# Patient Record
Sex: Female | Born: 1975 | Race: Black or African American | Hispanic: No | Marital: Single | Smoking: Former smoker
Health system: Southern US, Community
[De-identification: ages and names within clinical notes are randomized; demographics above are authoritative.]

## PROBLEM LIST (undated history)

## (undated) DIAGNOSIS — D219 Benign neoplasm of connective and other soft tissue, unspecified: Secondary | ICD-10-CM

## (undated) DIAGNOSIS — B019 Varicella without complication: Secondary | ICD-10-CM

## (undated) HISTORY — DX: Varicella without complication: B01.9

## (undated) HISTORY — DX: Benign neoplasm of connective and other soft tissue, unspecified: D21.9

## (undated) HISTORY — PX: NO PAST SURGERIES: SHX2092

---

## 2012-02-10 ENCOUNTER — Encounter: Payer: Self-pay | Admitting: *Deleted

## 2012-02-10 ENCOUNTER — Encounter: Payer: Self-pay | Admitting: Family Medicine

## 2012-02-10 ENCOUNTER — Ambulatory Visit (INDEPENDENT_AMBULATORY_CARE_PROVIDER_SITE_OTHER): Payer: No Typology Code available for payment source | Admitting: Family Medicine

## 2012-02-10 DIAGNOSIS — Z0000 Encounter for general adult medical examination without abnormal findings: Secondary | ICD-10-CM | POA: Insufficient documentation

## 2012-02-10 LAB — HEPATIC FUNCTION PANEL
ALT: 19 U/L (ref 0–35)
Albumin: 3.9 g/dL (ref 3.5–5.2)
Bilirubin, Direct: 0.1 mg/dL (ref 0.0–0.3)
Total Protein: 7.4 g/dL (ref 6.0–8.3)

## 2012-02-10 LAB — LIPID PANEL
Cholesterol: 153 mg/dL (ref 0–200)
LDL Cholesterol: 74 mg/dL (ref 0–99)
Triglycerides: 101 mg/dL (ref 0.0–149.0)

## 2012-02-10 LAB — BASIC METABOLIC PANEL
BUN: 6 mg/dL (ref 6–23)
CO2: 26 mEq/L (ref 19–32)
Calcium: 9.1 mg/dL (ref 8.4–10.5)
Chloride: 107 mEq/L (ref 96–112)
Creatinine, Ser: 1.1 mg/dL (ref 0.4–1.2)
Glucose, Bld: 87 mg/dL (ref 70–99)

## 2012-02-10 LAB — CBC WITH DIFFERENTIAL/PLATELET
Basophils Absolute: 0.1 10*3/uL (ref 0.0–0.1)
Basophils Relative: 0.8 % (ref 0.0–3.0)
HCT: 35.1 % — ABNORMAL LOW (ref 36.0–46.0)
Hemoglobin: 11.4 g/dL — ABNORMAL LOW (ref 12.0–15.0)
Lymphocytes Relative: 22.5 % (ref 12.0–46.0)
Lymphs Abs: 1.4 10*3/uL (ref 0.7–4.0)
MCHC: 32.6 g/dL (ref 30.0–36.0)
Monocytes Relative: 10.4 % (ref 3.0–12.0)
Neutro Abs: 4.1 10*3/uL (ref 1.4–7.7)
RBC: 4.19 Mil/uL (ref 3.87–5.11)
RDW: 18.4 % — ABNORMAL HIGH (ref 11.5–14.6)

## 2012-02-10 LAB — TSH: TSH: 1.31 u[IU]/mL (ref 0.35–5.50)

## 2012-02-10 NOTE — Patient Instructions (Signed)
We'll notify you of your lab results You look great!  Keep up the good work! Start an over the counter prenatal vitamin w/ DHA (fish oil) If you are anemic, we'll start an iron supplement to build up your levels prior to surgery Call with any questions or concerns Welcome!  We're glad to have you!!!

## 2012-02-10 NOTE — Progress Notes (Signed)
  Subjective:    Patient ID: Denise White, female    DOB: Feb 23, 1976, 36 y.o.   MRN: [id number]  HPI New to establish.  No previous PCP.  GYN- Dr Seymour Bars  Fibroids- has upcoming fibroid removal w/ Dr Seymour Bars.  Pt reports painful, heavy periods.  No other medical problems or concerns.   Review of Systems Patient reports no vision/ hearing changes, adenopathy,fever, weight change,  persistant/recurrent hoarseness , swallowing issues, chest pain, palpitations, edema, persistant/recurrent cough, hemoptysis, dyspnea (rest/exertional/paroxysmal nocturnal), gastrointestinal bleeding (melena, rectal bleeding), abdominal pain, significant heartburn, bowel changes, GU symptoms (dysuria, hematuria, incontinence),  syncope, focal weakness, memory loss, numbness & tingling, skin/hair/nail changes, abnormal bruising or bleeding, anxiety, or depression.     Objective:   Physical Exam  General Appearance:    Alert, cooperative, no distress, appears stated age  Head:    Normocephalic, without obvious abnormality, atraumatic  Eyes:    PERRL, conjunctiva/corneas clear, EOM's intact, fundi    benign, both eyes  Ears:    Normal TM's and external ear canals, both ears  Nose:   Nares normal, septum midline, mucosa normal, no drainage    or sinus tenderness  Throat:   Lips, mucosa, and tongue normal; teeth and gums normal  Neck:   Supple, symmetrical, trachea midline, no adenopathy;    Thyroid: no enlargement/tenderness/nodules  Back:     Symmetric, no curvature, ROM normal, no CVA tenderness  Lungs:     Clear to auscultation bilaterally, respirations unlabored  Chest Wall:    No tenderness or deformity   Heart:    Regular rate and rhythm, S1 and S2 normal, no murmur, rub   or gallop  Breast Exam:    No tenderness, masses, or nipple abnormality  Abdomen:     Soft, non-tender, bowel sounds active all four quadrants,    no masses, no organomegaly  Genitalia:    External genitalia normal, cervix normal in  appearance, no CMT, uterus in normal size and position, adnexa w/out mass or tenderness, mucosa pink and moist, no lesions or discharge present  Rectal:    Normal external appearance  Extremities:   Extremities normal, atraumatic, no cyanosis or edema  Pulses:   2+ and symmetric all extremities  Skin:   Skin color, texture, turgor normal, no rashes or lesions  Lymph nodes:   Cervical, supraclavicular, and axillary nodes normal  Neurologic:   CNII-XII intact, normal strength, sensation and reflexes    throughout          Assessment & Plan:

## 2012-02-10 NOTE — Progress Notes (Signed)
  Subjective:    Patient ID: Denise White, female    DOB: Dec 13, 1975, 36 y.o.   MRN: [id number]  HPI    Review of Systems     Objective:   Physical Exam OF NOTE- PT DID NOT HAVE BREAST OR GU EXAM (deferred to GYN).  All other PE documentation is correct.       Assessment & Plan:

## 2012-02-10 NOTE — Assessment & Plan Note (Signed)
Pt's PE WNL.  Check labs.  Anticipatory guidance provided.  

## 2012-02-12 LAB — VITAMIN D 1,25 DIHYDROXY
Vitamin D2 1, 25 (OH)2: <8 pg/mL
Vitamin D3 1, 25 (OH)2: 65 pg/mL

## 2012-02-13 ENCOUNTER — Encounter: Payer: Self-pay | Admitting: *Deleted

## 2012-05-05 ENCOUNTER — Encounter (HOSPITAL_COMMUNITY): Payer: Self-pay | Admitting: Pharmacist

## 2012-05-14 ENCOUNTER — Encounter (HOSPITAL_COMMUNITY): Payer: Self-pay

## 2012-05-14 ENCOUNTER — Encounter (HOSPITAL_COMMUNITY)
Admission: RE | Admit: 2012-05-14 | Discharge: 2012-05-14 | Disposition: A | Discharge status: Home / Self Care | Payer: No Typology Code available for payment source | Source: Ambulatory Visit | Attending: Obstetrics & Gynecology | Admitting: Obstetrics & Gynecology

## 2012-05-14 LAB — CBC
MCV: 89.9 fL (ref 78.0–100.0)
Platelets: 285 10*3/uL (ref 150–400)
RBC: 4.14 MIL/uL (ref 3.87–5.11)
RDW: 14.3 % (ref 11.5–15.5)
WBC: 6 10*3/uL (ref 4.0–10.5)

## 2012-05-14 LAB — SURGICAL PCR SCREEN
MRSA, PCR: NEGATIVE
Staphylococcus aureus: NEGATIVE

## 2012-05-14 NOTE — Patient Instructions (Addendum)
20 Rola Lennon  05/14/2012   Your procedure is scheduled on:  05/18/12  Enter through the Main Entrance of [facility] at 1130 AM.  Pick up the phone at the desk and dial [phone].   Call this number if you have problems the morning of surgery: [phone]   Remember:   Do not eat food:After Midnight.  Do not drink clear liquids: May have clear liquids up until 7AM day of surgery.  Take these medicines the morning of surgery with A SIP OF WATER: NA   Do not wear jewelry, make-up or nail polish.  Do not wear lotions, powders, or perfumes. You may wear deodorant.  Do not shave 48 hours prior to surgery.  Do not bring valuables to the hospital.  Contacts, dentures or bridgework may not be worn into surgery.  Leave suitcase in the car. After surgery it may be brought to your room.  For patients admitted to the hospital, checkout time is 11:00 AM the day of discharge.   Patients discharged the day of surgery will not be allowed to drive home.  Name and phone number of your driver: undecided  Special Instructions: CHG Shower Use Special Wash: 1/2 bottle night before surgery and 1/2 bottle morning of surgery.   Please read over the following fact sheets that you were given: MRSA Information

## 2012-05-17 ENCOUNTER — Other Ambulatory Visit: Payer: Self-pay | Admitting: Obstetrics & Gynecology

## 2012-05-18 ENCOUNTER — Encounter (HOSPITAL_COMMUNITY): Payer: Self-pay | Admitting: Registered Nurse

## 2012-05-18 ENCOUNTER — Encounter (HOSPITAL_COMMUNITY)
Admission: RE | Disposition: A | Discharge status: Home / Self Care | Payer: Self-pay | Source: Ambulatory Visit | Attending: Obstetrics & Gynecology

## 2012-05-18 ENCOUNTER — Ambulatory Visit (HOSPITAL_COMMUNITY): Payer: Self-pay | Admitting: Registered Nurse

## 2012-05-18 ENCOUNTER — Encounter (HOSPITAL_COMMUNITY): Payer: Self-pay | Admitting: *Deleted

## 2012-05-18 ENCOUNTER — Observation Stay (HOSPITAL_COMMUNITY)
Admission: RE | Admit: 2012-05-18 | Discharge: 2012-05-18 | Disposition: A | Discharge status: Home / Self Care | Payer: MEDICAID | Source: Ambulatory Visit | Attending: Obstetrics & Gynecology | Admitting: Obstetrics & Gynecology

## 2012-05-18 DIAGNOSIS — N949 Unspecified condition associated with female genital organs and menstrual cycle: Secondary | ICD-10-CM | POA: Insufficient documentation

## 2012-05-18 DIAGNOSIS — D251 Intramural leiomyoma of uterus: Principal | ICD-10-CM | POA: Insufficient documentation

## 2012-05-18 DIAGNOSIS — N920 Excessive and frequent menstruation with regular cycle: Secondary | ICD-10-CM | POA: Insufficient documentation

## 2012-05-18 DIAGNOSIS — Z01812 Encounter for preprocedural laboratory examination: Secondary | ICD-10-CM | POA: Insufficient documentation

## 2012-05-18 DIAGNOSIS — Z01818 Encounter for other preprocedural examination: Secondary | ICD-10-CM | POA: Insufficient documentation

## 2012-05-18 HISTORY — PX: ROBOT ASSISTED MYOMECTOMY: SHX5142

## 2012-05-18 LAB — TYPE AND SCREEN
ABO/RH(D): O POS
Antibody Screen: NEGATIVE

## 2012-05-18 LAB — CBC
MCH: 28.9 pg (ref 26.0–34.0)
MCV: 90.2 fL (ref 78.0–100.0)
Platelets: 261 10*3/uL (ref 150–400)
RBC: 3.88 MIL/uL (ref 3.87–5.11)
RDW: 13.8 % (ref 11.5–15.5)

## 2012-05-18 LAB — PREGNANCY, URINE: Preg Test, Ur: NEGATIVE

## 2012-05-18 SURGERY — ROBOTIC ASSISTED MYOMECTOMY
Anesthesia: General | Site: Abdomen | Wound class: Clean Contaminated

## 2012-05-18 MED ORDER — ACETAMINOPHEN 10 MG/ML IV SOLN
1000.0000 mg | Freq: Once | INTRAVENOUS | Status: AC
  Administered 2012-05-18: 1000 mg via INTRAVENOUS
  Filled 2012-05-18: qty 100

## 2012-05-18 MED ORDER — DEXAMETHASONE SODIUM PHOSPHATE 10 MG/ML IJ SOLN
INTRAMUSCULAR | Status: AC
  Filled 2012-05-18: qty 1

## 2012-05-18 MED ORDER — VASOPRESSIN 20 UNIT/ML IJ SOLN
INTRAMUSCULAR | Status: DC | PRN
  Administered 2012-05-18: 40 [IU] via INTRAMUSCULAR

## 2012-05-18 MED ORDER — FENTANYL CITRATE 0.05 MG/ML IJ SOLN
INTRAMUSCULAR | Status: AC
  Filled 2012-05-18: qty 5

## 2012-05-18 MED ORDER — FENTANYL CITRATE 0.05 MG/ML IJ SOLN
INTRAMUSCULAR | Status: AC
  Administered 2012-05-18: 50 ug via INTRAVENOUS
  Filled 2012-05-18: qty 2

## 2012-05-18 MED ORDER — IBUPROFEN 600 MG PO TABS: 600.0000 mg | ORAL_TABLET | Freq: Four times a day (QID) | ORAL | Status: DC | PRN

## 2012-05-18 MED ORDER — LACTATED RINGERS IR SOLN
Status: DC | PRN
  Administered 2012-05-18: 1

## 2012-05-18 MED ORDER — MIDAZOLAM HCL 5 MG/5ML IJ SOLN
INTRAMUSCULAR | Status: DC | PRN
  Administered 2012-05-18: 2 mg via INTRAVENOUS

## 2012-05-18 MED ORDER — LACTATED RINGERS IV SOLN
INTRAVENOUS | Status: DC
  Administered 2012-05-18 (×2): via INTRAVENOUS

## 2012-05-18 MED ORDER — DEXAMETHASONE SODIUM PHOSPHATE 4 MG/ML IJ SOLN
INTRAMUSCULAR | Status: DC | PRN
  Administered 2012-05-18: 10 mg via INTRAVENOUS

## 2012-05-18 MED ORDER — ONDANSETRON HCL 4 MG/2ML IJ SOLN
INTRAMUSCULAR | Status: DC | PRN
  Administered 2012-05-18: 4 mg via INTRAVENOUS

## 2012-05-18 MED ORDER — MEPERIDINE HCL 25 MG/ML IJ SOLN: 6.2500 mg | INTRAMUSCULAR | Status: DC | PRN

## 2012-05-18 MED ORDER — METOCLOPRAMIDE HCL 5 MG/ML IJ SOLN: 10.0000 mg | Freq: Once | INTRAMUSCULAR | Status: DC | PRN

## 2012-05-18 MED ORDER — LACTATED RINGERS IV SOLN: INTRAVENOUS | Status: DC

## 2012-05-18 MED ORDER — GLYCOPYRROLATE 0.2 MG/ML IJ SOLN
INTRAMUSCULAR | Status: DC | PRN
  Administered 2012-05-18: 0.6 mg via INTRAVENOUS

## 2012-05-18 MED ORDER — VASOPRESSIN 20 UNIT/ML IJ SOLN
INTRAMUSCULAR | Status: AC
  Filled 2012-05-18: qty 2

## 2012-05-18 MED ORDER — FENTANYL CITRATE 0.05 MG/ML IJ SOLN
INTRAMUSCULAR | Status: DC | PRN
  Administered 2012-05-18 (×2): 50 ug via INTRAVENOUS
  Administered 2012-05-18: 100 ug via INTRAVENOUS
  Administered 2012-05-18: 50 ug via INTRAVENOUS

## 2012-05-18 MED ORDER — LIDOCAINE HCL (CARDIAC) 20 MG/ML IV SOLN
INTRAVENOUS | Status: AC
  Filled 2012-05-18: qty 5

## 2012-05-18 MED ORDER — LIDOCAINE HCL (CARDIAC) 20 MG/ML IV SOLN
INTRAVENOUS | Status: DC | PRN
  Administered 2012-05-18: 60 mg via INTRAVENOUS

## 2012-05-18 MED ORDER — ROCURONIUM BROMIDE 50 MG/5ML IV SOLN
INTRAVENOUS | Status: AC
  Filled 2012-05-18: qty 2

## 2012-05-18 MED ORDER — OXYCODONE-ACETAMINOPHEN 7.5-325 MG PO TABS: 1.0000 | ORAL_TABLET | ORAL | Status: AC | PRN

## 2012-05-18 MED ORDER — BUPIVACAINE HCL (PF) 0.25 % IJ SOLN
INTRAMUSCULAR | Status: DC | PRN
  Administered 2012-05-18: 10 mL

## 2012-05-18 MED ORDER — ROCURONIUM BROMIDE 100 MG/10ML IV SOLN
INTRAVENOUS | Status: DC | PRN
  Administered 2012-05-18 (×2): 10 mg via INTRAVENOUS
  Administered 2012-05-18: 50 mg via INTRAVENOUS

## 2012-05-18 MED ORDER — ARTIFICIAL TEARS OP OINT
TOPICAL_OINTMENT | OPHTHALMIC | Status: AC
  Filled 2012-05-18: qty 3.5

## 2012-05-18 MED ORDER — SCOPOLAMINE 1 MG/3DAYS TD PT72
1.0000 | MEDICATED_PATCH | Freq: Once | TRANSDERMAL | Status: DC
  Administered 2012-05-18: 1.5 mg via TRANSDERMAL

## 2012-05-18 MED ORDER — NEOSTIGMINE METHYLSULFATE 1 MG/ML IJ SOLN
INTRAMUSCULAR | Status: DC | PRN
  Administered 2012-05-18: 3 mg via INTRAVENOUS

## 2012-05-18 MED ORDER — PROPOFOL 10 MG/ML IV EMUL
INTRAVENOUS | Status: DC | PRN
  Administered 2012-05-18: 200 mg via INTRAVENOUS

## 2012-05-18 MED ORDER — PROPOFOL 10 MG/ML IV EMUL
INTRAVENOUS | Status: AC
  Filled 2012-05-18: qty 20

## 2012-05-18 MED ORDER — HYDROMORPHONE HCL PF 1 MG/ML IJ SOLN: 1.0000 mg | INTRAMUSCULAR | Status: DC | PRN

## 2012-05-18 MED ORDER — MIDAZOLAM HCL 2 MG/2ML IJ SOLN
INTRAMUSCULAR | Status: AC
  Filled 2012-05-18: qty 2

## 2012-05-18 MED ORDER — ONDANSETRON HCL 4 MG/2ML IJ SOLN
INTRAMUSCULAR | Status: AC
  Filled 2012-05-18: qty 2

## 2012-05-18 MED ORDER — OXYCODONE-ACETAMINOPHEN 5-325 MG PO TABS
1.0000 | ORAL_TABLET | ORAL | Status: DC | PRN
  Administered 2012-05-18: 2 via ORAL
  Filled 2012-05-18: qty 2

## 2012-05-18 MED ORDER — SCOPOLAMINE 1 MG/3DAYS TD PT72
MEDICATED_PATCH | TRANSDERMAL | Status: AC
  Filled 2012-05-18: qty 1

## 2012-05-18 MED ORDER — BUPIVACAINE HCL (PF) 0.25 % IJ SOLN
INTRAMUSCULAR | Status: AC
  Filled 2012-05-18: qty 30

## 2012-05-18 MED ORDER — FENTANYL CITRATE 0.05 MG/ML IJ SOLN
25.0000 ug | INTRAMUSCULAR | Status: DC | PRN
  Administered 2012-05-18 (×3): 50 ug via INTRAVENOUS

## 2012-05-18 MED ORDER — DEXTROSE 5 % IV SOLN
2.0000 g | Freq: Once | INTRAVENOUS | Status: AC
  Administered 2012-05-18: 2 g via INTRAVENOUS
  Filled 2012-05-18: qty 2

## 2012-05-18 SURGICAL SUPPLY — 56 items
BARRIER ADHS 3X4 INTERCEED (GAUZE/BANDAGES/DRESSINGS) ×2 IMPLANT
BLADE LAP MORCELLATOR 15X9.5 (ELECTROSURGICAL) IMPLANT
BLADE MORCELLATOR EXT  12.5X15 (ELECTROSURGICAL) ×1
BLADE MORCELLATOR EXT 12.5X15 (ELECTROSURGICAL) ×1 IMPLANT
CLOTH BEACON ORANGE TIMEOUT ST (SAFETY) ×2 IMPLANT
DECANTER SPIKE VIAL GLASS SM (MISCELLANEOUS) ×2 IMPLANT
DERMABOND ADVANCED (GAUZE/BANDAGES/DRESSINGS) ×1
DERMABOND ADVANCED .7 DNX12 (GAUZE/BANDAGES/DRESSINGS) ×1 IMPLANT
DRAPE HUG U DISPOSABLE (DRAPE) ×2 IMPLANT
DRAPE LG THREE QUARTER DISP (DRAPES) ×4 IMPLANT
DRAPE MONITOR DA VINCI (DRAPE) IMPLANT
DRAPE WARM FLUID 44X44 (DRAPE) ×2 IMPLANT
ELECT REM PT RETURN 9FT ADLT (ELECTROSURGICAL) ×2
ELECTRODE REM PT RTRN 9FT ADLT (ELECTROSURGICAL) ×1 IMPLANT
EVACUATOR SMOKE 8.L (FILTER) ×2 IMPLANT
GAUZE VASELINE 3X9 (GAUZE/BANDAGES/DRESSINGS) IMPLANT
GLOVE BIO SURGEON STRL SZ 6.5 (GLOVE) ×4 IMPLANT
GLOVE BIOGEL PI IND STRL 7.0 (GLOVE) ×1 IMPLANT
GLOVE BIOGEL PI INDICATOR 7.0 (GLOVE) ×1
GLOVE ECLIPSE 6.5 STRL STRAW (GLOVE) ×6 IMPLANT
GOWN STRL REIN XL XLG (GOWN DISPOSABLE) ×12 IMPLANT
IV SET ADMIN PUMP GEMINI W/NLD (IV SETS) IMPLANT
IV STOPCOCK 4 WAY 40  W/Y SET (IV SOLUTION) ×1
IV STOPCOCK 4 WAY 40 W/Y SET (IV SOLUTION) ×1 IMPLANT
KIT ACCESSORY DA VINCI DISP (KITS) ×1
KIT ACCESSORY DVNC DISP (KITS) ×1 IMPLANT
NEEDLE HYPO 22GX1.5 SAFETY (NEEDLE) ×2 IMPLANT
OCCLUDER COLPOPNEUMO (BALLOONS) ×2 IMPLANT
PACK LAVH (CUSTOM PROCEDURE TRAY) ×2 IMPLANT
SET CYSTO W/LG BORE CLAMP LF (SET/KITS/TRAYS/PACK) IMPLANT
SET IRRIG TUBING LAPAROSCOPIC (IRRIGATION / IRRIGATOR) ×4 IMPLANT
SOLUTION ELECTROLUBE (MISCELLANEOUS) ×2 IMPLANT
SUT DVC VLOC 180 2-0 12IN GS21 (SUTURE) ×2
SUT VIC AB 0 CT1 27 (SUTURE)
SUT VIC AB 0 CT1 27XBRD ANTBC (SUTURE) IMPLANT
SUT VIC AB 4-0 PS2 27 (SUTURE) IMPLANT
SUT VICRYL 0 UR6 27IN ABS (SUTURE) ×4 IMPLANT
SUT VLOC 180 0 9IN  GS21 (SUTURE) ×2
SUT VLOC 180 0 9IN GS21 (SUTURE) ×2 IMPLANT
SUT VLOC 180 2-0 6IN GS21 (SUTURE) ×2 IMPLANT
SUT VLOC 180 2-0 9IN GS21 (SUTURE) IMPLANT
SUTURE DVC VL 180 2-0 12INGS21 (SUTURE) ×1 IMPLANT
SYR 50ML LL SCALE MARK (SYRINGE) ×2 IMPLANT
SYSTEM CONVERTIBLE TROCAR (TROCAR) ×2 IMPLANT
TIP UTERINE 6.7X10CM GRN DISP (MISCELLANEOUS) IMPLANT
TIP UTERINE 6.7X8CM BLUE DISP (MISCELLANEOUS) ×2 IMPLANT
TOWEL OR 17X24 6PK STRL BLUE (TOWEL DISPOSABLE) ×4 IMPLANT
TRAY FOLEY BAG SILVER LF 14FR (CATHETERS) ×2 IMPLANT
TROCAR 12M 150ML BLUNT (TROCAR) IMPLANT
TROCAR DISP BLADELESS 8 DVNC (TROCAR) ×1 IMPLANT
TROCAR DISP BLADELESS 8MM (TROCAR) ×1
TROCAR XCEL 12X100 BLDLESS (ENDOMECHANICALS) ×2 IMPLANT
TROCAR XCEL NON-BLD 5MMX100MML (ENDOMECHANICALS) ×2 IMPLANT
TROCAR Z-THREAD BLADED 12X100M (TROCAR) IMPLANT
TUBING FILTER THERMOFLATOR (ELECTROSURGICAL) ×2 IMPLANT
WATER STERILE IRR 1000ML POUR (IV SOLUTION) ×6 IMPLANT

## 2012-05-18 NOTE — Transfer of Care (Signed)
Immediate Anesthesia Transfer of Care Note  Patient: Denise White  Procedure(s) Performed: Procedure(s) (LRB): ROBOTIC ASSISTED MYOMECTOMY (N/A)  Patient Location: PACU  Anesthesia Type: General  Level of Consciousness: sedated  Airway & Oxygen Therapy: Patient Spontanous Breathing and Patient connected to nasal cannula oxygen  Post-op Assessment: Report given to PACU RN and Post -op Vital signs reviewed and stable  Post vital signs: stable  Complications: No apparent anesthesia complications

## 2012-05-18 NOTE — H&P (Signed)
Lydiah Pong is an 36 y.o. 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    Past Medical History  Diagnosis Date  . Fibroid   . Chicken pox     Past Surgical History  Procedure Date  . No past surgeries     Family History  Problem Relation Age of Onset  . Hypertension Father     Social History:  reports that she has quit smoking. She does not have any smokeless tobacco history on file. She reports that she does not drink alcohol or use illicit drugs.  Allergies: No Known Allergies  Prescriptions prior to admission  Medication Sig Dispense Refill  . aspirin 325 MG tablet Take 325 mg by mouth as needed.      . Diphenhydramine-APAP, sleep, (GOODY PM PO) Take 1 packet by mouth as needed.      . Prenatal Vit-Fe Fumarate-FA (PRENATAL MULTIVITAMIN) TABS Take 1 tablet by mouth daily.      Marland Kitchen ibuprofen (ADVIL,MOTRIN) 200 MG tablet Take 200 mg by mouth every 6 (six) hours as needed.        There were no vitals taken for this visit.  No results found for this or any previous visit (from the past 24 hour(s)).  No results found.  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  Surgery and risks reviewed.  Charlies Rayburn,MARIE-LYNE 05/18/2012, 11:32 AM

## 2012-05-18 NOTE — Anesthesia Preprocedure Evaluation (Addendum)
Anesthesia Evaluation  Patient identified by MRN, date of birth, ID band Patient awake    Reviewed: Allergy & Precautions, H&P , NPO status , Patient's Chart, lab work & pertinent test results  Airway Mallampati: II TM Distance: >3 FB Neck ROM: Full    Dental No notable dental hx. (+) Teeth Intact   Pulmonary former smoker,  breath sounds clear to auscultation  Pulmonary exam normal       Cardiovascular negative cardio ROS  Rhythm:Regular Rate:Normal     Neuro/Psych negative neurological ROS  negative psych ROS   GI/Hepatic negative GI ROS, Neg liver ROS,   Endo/Other  negative endocrine ROS  Renal/GU negative Renal ROS  negative genitourinary   Musculoskeletal negative musculoskeletal ROS (+)   Abdominal Normal abdominal exam  (+)   Peds  Hematology negative hematology ROS (+)   Anesthesia Other Findings   Reproductive/Obstetrics Uterine Fibroids Pelvic Pain                          Anesthesia Physical Anesthesia Plan  ASA: I  Anesthesia Plan: General   Post-op Pain Management:    Induction: Intravenous  Airway Management Planned: Oral ETT  Additional Equipment:   Intra-op Plan:   Post-operative Plan: Extubation in OR  Informed Consent: I have reviewed the patients History and Physical, chart, labs and discussed the procedure including the risks, benefits and alternatives for the proposed anesthesia with the patient or authorized representative who has indicated his/her understanding and acceptance.   Dental advisory given  Plan Discussed with: CRNA, Anesthesiologist and Surgeon  Anesthesia Plan Comments:        Anesthesia Quick Evaluation

## 2012-05-18 NOTE — Anesthesia Postprocedure Evaluation (Signed)
  Anesthesia Post-op Note  Patient: Denise White  Procedure(s) Performed: Procedure(s) (LRB): ROBOTIC ASSISTED MYOMECTOMY (N/A)  Patient is awake and responsive. Pain and nausea are reasonably well controlled. Vital signs are stable and clinically acceptable. Oxygen saturation is clinically acceptable. There are no apparent anesthetic complications at this time. Patient is ready for discharge.

## 2012-05-18 NOTE — Op Note (Signed)
05/18/2012  3:48 PM  PATIENT:  Denise White  36 y.o. female  PRE-OPERATIVE DIAGNOSIS:  Fibroids; Pelvic Pain  POST-OPERATIVE DIAGNOSIS:  Fibroids; Pelvic Pain  PROCEDURE:  Procedure(s): ROBOTIC ASSISTED MYOMECTOMY  SURGEON:  Surgeon(s): Genia Del, MD  ASSISTANTS: Arlan Organ   ANESTHESIA:   general  PROCEDURE:  Under general anesthesia with endotracheal intubation, the patient is in lithotomy position.  She is prepped with ChloraPrep on the abdomen and with Betadine on the suprapubic vulvar and vaginal areas. She is draped as usual. The vaginal exam reveals a mobile large uterus about 14 cm, no adnexal mass.  The weighted speculum was introduced in the vagina, the anterior lip of the cervix is grasped with a tenaculum.  Hysterometry is at 11 cm. Dilation of the cervix with Hegar dilators up to number arm 21 without difficulty. We choose a #8 the roomy with a small Coe ring and this is put in place without problems.  The Foley is put in place in the bladder. The other instruments are removed from the vagina.  Abdominally we make a 1.2 cm incision at the supraumbilical area after Marcaine infiltration. We opened the aponeurosis with Mayo scissors under direct vision. We opened and the peritoneum bluntly with a finger. A pursestring stitch of Vicryl 0 is put on the aponeurosis. The Roseanne Reno is introduced at that level after pneumoperitoneum is created with CO2 and the camera is inserted.  The uterus is enlarged with an intramural anterior myoma measuring about 10 cm in diameter. Both ovaries are normal to inspection as well as both tubes. No other pathology is seen in the pelvis. No pathology is seen in the abdomen. We used a semicircular configuration for port placement.  Each site is infiltrated with Marcaine and a small incision is made with a 2 robotic ports are introduced under direct vision on the right side, one robotic port on the lower left and the 5 mm assistant port on the  upper left.  The robot his docked from the right side. The Endo Shears scissor is inserted in the first arm, the PK in the second arm and the tenaculum and the third arm.  We infiltrate Pitressin vertically along the myoma anteriorly.  20 cc of a concentration of 20 and 100 is infiltrated.  We then used the the Endo Shears scissor to make an incision vertically on the anterior myoma, the incision is about 10 cm.  The PK is used to assure good hemostasis all along the we used traction and countertraction as well as the PK in the Endo Shears scissor to dissect and removed the myoma.  Once completely detached the myoma is put in the left gutter. We switch the instruments to the cutting needle driver in the right hand, the mega  needle driver in the left hand and the PK in the third arm.  We used the V. LOC 0 to close the myometrium with a running suture. The endometrial cavity was visible and bulging but not entered.  We then used a on V. LOC 2-0 in a baseball stitch to close the serosa. Hemostasis was adequate at all levels. We irrigated and suctioned the abdominopelvic cavities. We then removed all robotic instruments. We undocked the robot. And went to laparoscopy. The 4 needles were removed from on the abdominal cavity.  We then put the morcellator in place at the supraumbilical incision. Morcellation was completed efficiently and without any problem. The weight of the myoma was 441 g. We irrigated and  suctioned the abdominopelvic cavities again. Hemostasis was adequate. An Interceed was applied on the line of suture.  All laparoscopy instruments were removed. The trochars were removed under direct vision. The CO2 was evacuated. The pursestring stitch was closed at the supraumbilical incision. A figure-of-eight of Vicryl 0 was added to reinforce the closure of the aponeurosis at that level.  The Bovie was used to complete hemostasis on the incisions. We then closed all incisions with a subcuticular stitch of Vicryl  4-0. Dermabond was added on all incisions. The roomy and co-ring were removed from the vagina.  The Foley was removed from the bladder. The patient was brought to recovery room in good and stable status. ESTIMATED BLOOD LOSS: 100 cc   Intake/Output Summary (Last 24 hours) at 05/18/12 1548 Last data filed at 05/18/12 1544  Gross per 24 hour  Intake   1700 ml  Output    300 ml  Net   1400 ml     BLOOD ADMINISTERED:none   LOCAL MEDICATIONS USED:  MARCAINE     SPECIMEN:  Source of Specimen:  Uterine myoma, 441 g  DISPOSITION OF SPECIMEN:  PATHOLOGY  COUNTS:  YES  PLAN OF CARE: Transfer to PACU   Genia Del MD  05/18/2012  At 3:50 pm

## 2012-05-19 ENCOUNTER — Encounter (HOSPITAL_COMMUNITY): Payer: Self-pay | Admitting: Obstetrics & Gynecology

## 2012-05-24 NOTE — Discharge Summary (Signed)
  Physician Discharge Summary  Patient ID: Denise White MRN: [id number] DOB/AGE: [birth year] 36 y.o.  Admit date: 05/18/2012 Discharge date: 05/24/2012  Admission Diagnoses: fibroids;pelvic pain  Discharge Diagnoses: fibroids;pelvic pain        Active Problems:  * No active hospital problems. *    Discharged Condition: good  Hospital Course: Good  Consults: None  Treatments: surgery: Robotic myomectomy  Disposition: 01-Home or Self Care   Medication List  As of 05/24/2012  9:40 AM   TAKE these medications         aspirin 325 MG tablet   Take 325 mg by mouth as needed.      GOODY PM PO   Take 1 packet by mouth as needed.      ibuprofen 200 MG tablet   Commonly known as: ADVIL,MOTRIN   Take 200 mg by mouth every 6 (six) hours as needed.      oxyCODONE-acetaminophen 7.5-325 MG per tablet   Commonly known as: PERCOCET   Take 1 tablet by mouth every 4 (four) hours as needed for pain.      prenatal multivitamin Tabs   Take 1 tablet by mouth daily.             SignedGenia Del, MD 05/24/2012, 9:40 AM

## 2012-10-04 LAB — HM PAP SMEAR

## 2015-06-25 ENCOUNTER — Telehealth: Payer: Self-pay | Admitting: Medical

## 2015-06-25 ENCOUNTER — Encounter: Payer: No Typology Code available for payment source | Admitting: Medical

## 2015-06-25 NOTE — Progress Notes (Signed)
This encounter was created in error - please disregard.

## 2015-07-04 NOTE — Telephone Encounter (Signed)
Pt was no show 06/25/15 1:30pm for new pt appt, pt has not rescheduled, charge for no show?

## 2015-07-04 NOTE — Telephone Encounter (Signed)
charge 

## 2015-12-06 ENCOUNTER — Encounter: Payer: Self-pay | Admitting: Family Medicine

## 2015-12-06 ENCOUNTER — Ambulatory Visit (INDEPENDENT_AMBULATORY_CARE_PROVIDER_SITE_OTHER): Payer: BLUE CROSS/BLUE SHIELD | Admitting: Family Medicine

## 2015-12-06 VITALS — BP 120/72 | HR 92 | Temp 98.6°F | Resp 16 | Wt 195.0 lb

## 2015-12-06 DIAGNOSIS — R0982 Postnasal drip: Secondary | ICD-10-CM

## 2015-12-06 MED ORDER — FLUTICASONE PROPIONATE 50 MCG/ACT NA SUSP: 2.0000 | Freq: Every day | NASAL | Status: DC

## 2015-12-06 MED ORDER — ALBUTEROL SULFATE HFA 108 (90 BASE) MCG/ACT IN AERS: 2.0000 | INHALATION_SPRAY | Freq: Four times a day (QID) | RESPIRATORY_TRACT | Status: DC | PRN

## 2015-12-06 MED ORDER — PROMETHAZINE-DM 6.25-15 MG/5ML PO SYRP: 5.0000 mL | ORAL_SOLUTION | Freq: Four times a day (QID) | ORAL | Status: DC | PRN

## 2015-12-06 MED ORDER — CETIRIZINE HCL 10 MG PO TABS: 10.0000 mg | ORAL_TABLET | Freq: Every day | ORAL | Status: DC

## 2015-12-06 NOTE — Assessment & Plan Note (Signed)
Pt w/ copious PND which is most likely the cause of pt's odor and intermittent dysphagia.  Start daily antihistamine and nasal steroid.  Reviewed supportive care and red flags that should prompt return.  Pt expressed understanding and is in agreement w/ plan.

## 2015-12-06 NOTE — Patient Instructions (Signed)
Follow up as needed Start Zyrtec daily to decrease your nasal congestion Use the Flonase- 2 sprays each nostril Drink plenty of fluids REST! Call with any questions or concerns Hang in there!!!

## 2015-12-06 NOTE — Progress Notes (Signed)
Pre visit review using our clinic review tool, if applicable. No additional management support is needed unless otherwise documented below in the visit note. 

## 2015-12-06 NOTE — Progress Notes (Signed)
   Subjective:    Patient ID: Denise White, female    DOB: 22-Oct-1975, 40 y.o.   MRN: [id number]  HPI Sore throat- pt reports ~1 yr ago she noticed 'something different'.  4 months ago reports sputum has 'odor to it'.  Will gargle w/ salt water w/ some improvement in sxs.  Pt reports some difficulty w/ swallowing capsules.  No difficulty w/ liquids or food.  + nasal congestion.  + PND.  Denies GERD.   Review of Systems For ROS see HPI     Objective:   Physical Exam  Constitutional: She is oriented to person, place, and time. She appears well-developed and well-nourished. No distress.  HENT:  Head: Normocephalic and atraumatic.  Right Ear: Tympanic membrane normal.  Left Ear: Tympanic membrane normal.  Nose: Mucosal edema and rhinorrhea present. Right sinus exhibits no maxillary sinus tenderness and no frontal sinus tenderness. Left sinus exhibits no maxillary sinus tenderness and no frontal sinus tenderness.  Mouth/Throat: Mucous membranes are normal. Posterior oropharyngeal erythema (w/ PND) present.  Eyes: Conjunctivae and EOM are normal. Pupils are equal, round, and reactive to light.  Neck: Normal range of motion. Neck supple.  Cardiovascular: Normal rate, regular rhythm and normal heart sounds.   Pulmonary/Chest: Effort normal and breath sounds normal. No respiratory distress. She has no wheezes. She has no rales.  Lymphadenopathy:    She has no cervical adenopathy.  Neurological: She is alert and oriented to person, place, and time.  Skin: Skin is warm and dry.  Vitals reviewed.         Assessment & Plan:

## 2016-01-11 ENCOUNTER — Encounter: Payer: Self-pay | Admitting: Family Medicine

## 2016-01-11 ENCOUNTER — Ambulatory Visit (INDEPENDENT_AMBULATORY_CARE_PROVIDER_SITE_OTHER): Payer: BLUE CROSS/BLUE SHIELD | Admitting: Family Medicine

## 2016-01-11 VITALS — BP 122/74 | HR 84 | Temp 98.1°F | Resp 16 | Ht 71.0 in | Wt 195.0 lb

## 2016-01-11 DIAGNOSIS — M5410 Radiculopathy, site unspecified: Secondary | ICD-10-CM

## 2016-01-11 DIAGNOSIS — M5416 Radiculopathy, lumbar region: Secondary | ICD-10-CM

## 2016-01-11 MED ORDER — PREDNISONE 10 MG PO TABS: ORAL_TABLET | ORAL | Status: DC

## 2016-01-11 MED ORDER — CYCLOBENZAPRINE HCL 5 MG PO TABS: 5.0000 mg | ORAL_TABLET | Freq: Three times a day (TID) | ORAL | Status: DC | PRN

## 2016-01-11 NOTE — Progress Notes (Signed)
   Subjective:    Patient ID: Denise White, female    DOB: [birth year], 40 y.o.   MRN: [id number]  HPI LBP- pt reports spasm or twinge in low back on Monday.  Was able to complete her workout- more pain w/ squats.  Pain worsened Tuesday.  No known injury.  Now not able to bend forward.  Pain is more sacral than lumbar.  Pain will alternate between dull and 'gripping'.  Mild and temporary improvement w/ massage.  + SLR L>R.  Has used heat and tens.  No bowel or bladder incontinence.  Denies weakness.  Difficulty rising from a seated position.     Review of Systems For ROS see HPI     Objective:   Physical Exam  Constitutional: She is oriented to person, place, and time. She appears well-developed and well-nourished.  Obviously uncomfortable  HENT:  Head: Normocephalic and atraumatic.  Cardiovascular: Intact distal pulses.   Musculoskeletal: She exhibits tenderness (TTP over lumbar sacral spine).  Neurological: She is alert and oriented to person, place, and time. She has normal reflexes. No cranial nerve deficit.  + SLR on L, (-) on R  Skin: Skin is warm and dry.  Psychiatric: She has a normal mood and affect. Her behavior is normal. Thought content normal.  Vitals reviewed.         Assessment & Plan:

## 2016-01-11 NOTE — Progress Notes (Signed)
Pre visit review using our clinic review tool, if applicable. No additional management support is needed unless otherwise documented below in the visit note. 

## 2016-01-11 NOTE — Patient Instructions (Signed)
Follow up as needed Start the Prednisone as directed- take w/ food Use the Flexeril before bed and on weekends (will cause drowsiness) Continue heat and TENS for pain relief Call with any questions or concerns- particularly if not improving Hang in there!!!

## 2016-01-11 NOTE — Assessment & Plan Note (Signed)
New.  Pt's pain and sxs are consistent w/ lumbar radiculopathy.  No known injury.  No red flags on hx or PE.  Start pred taper.  Flexeril prn.  Reviewed supportive care and red flags that should prompt return.  Pt expressed understanding and is in agreement w/ plan.

## 2018-02-23 ENCOUNTER — Encounter: Payer: Self-pay | Admitting: General Practice

## 2018-08-08 ENCOUNTER — Encounter (HOSPITAL_BASED_OUTPATIENT_CLINIC_OR_DEPARTMENT_OTHER): Payer: Self-pay | Admitting: Emergency Medicine

## 2018-08-08 ENCOUNTER — Other Ambulatory Visit: Payer: Self-pay

## 2018-08-08 ENCOUNTER — Emergency Department (HOSPITAL_BASED_OUTPATIENT_CLINIC_OR_DEPARTMENT_OTHER)
Admission: EM | Admit: 2018-08-08 | Discharge: 2018-08-08 | Disposition: A | Discharge status: Home / Self Care | Payer: BLUE CROSS/BLUE SHIELD | Attending: Emergency Medicine | Admitting: Emergency Medicine

## 2018-08-08 DIAGNOSIS — Y9389 Activity, other specified: Secondary | ICD-10-CM | POA: Insufficient documentation

## 2018-08-08 DIAGNOSIS — R51 Headache: Secondary | ICD-10-CM | POA: Insufficient documentation

## 2018-08-08 DIAGNOSIS — W228XXA Striking against or struck by other objects, initial encounter: Secondary | ICD-10-CM | POA: Insufficient documentation

## 2018-08-08 DIAGNOSIS — S0990XA Unspecified injury of head, initial encounter: Secondary | ICD-10-CM | POA: Insufficient documentation

## 2018-08-08 DIAGNOSIS — Z87891 Personal history of nicotine dependence: Secondary | ICD-10-CM | POA: Diagnosis not present

## 2018-08-08 DIAGNOSIS — Y998 Other external cause status: Secondary | ICD-10-CM | POA: Diagnosis not present

## 2018-08-08 DIAGNOSIS — Z79899 Other long term (current) drug therapy: Secondary | ICD-10-CM | POA: Diagnosis not present

## 2018-08-08 DIAGNOSIS — Y9289 Other specified places as the place of occurrence of the external cause: Secondary | ICD-10-CM | POA: Insufficient documentation

## 2018-08-08 DIAGNOSIS — F0781 Postconcussional syndrome: Secondary | ICD-10-CM | POA: Diagnosis not present

## 2018-08-08 NOTE — ED Provider Notes (Signed)
[facility] EMERGENCY DEPARTMENT Provider Note   CSN: [id number] Arrival date & time: 08/08/18  1135     History   Chief Complaint Chief Complaint  Patient presents with  . Head Injury    HPI Jaasia Viglione is a 42 y.o. female.  HPI  42 year old female with no significant medical history comes in with chief complaint of head injury.  Patient was trying to lift a table top overhead when it flipped and hit her on the top of her head.  Patient did not have any significant symptoms initially, but as the day progressed she started having some tightness over her neck area and mild headache.  Patient wanted to make sure that she did not have any skull fracture or brain bleed, therefore she came to the ER.  Since the injury patient has driven 3 hours from out of town to [place].  She also denies any nausea, vomiting, seizure-like activity, altered mental status, dizziness, vision changes, focal numbness or weakness.  Patient does not take any anticoagulants.  Past Medical History:  Diagnosis Date  . Chicken pox   . Fibroid     Patient Active Problem List   Diagnosis Date Noted  . Acute radicular low back pain 01/11/2016  . Post-nasal drip 12/06/2015  . General medical examination 02/10/2012    Past Surgical History:  Procedure Laterality Date  . NO PAST SURGERIES    . ROBOT ASSISTED MYOMECTOMY  05/18/2012   Procedure: ROBOTIC ASSISTED MYOMECTOMY;  Surgeon: Princess Bruins, MD;  Location: [facility];  Service: Gynecology;  Laterality: N/A;  [place]     OB History   None      Home Medications    Prior to Admission medications   Medication Sig Start Date End Date Taking? Authorizing Provider  albuterol (PROVENTIL HFA;VENTOLIN HFA) 108 (90 Base) MCG/ACT inhaler Inhale 2 puffs into the lungs every 6 (six) hours as needed for wheezing or shortness of breath. 12/06/15   Midge Minium, MD  aspirin 325 MG tablet Take 325 mg by mouth as needed.    [provider]  cetirizine (ZYRTEC) 10 MG tablet Take 1 tablet (10 mg total) by mouth daily. 12/06/15   Midge Minium, MD  cyclobenzaprine (FLEXERIL) 5 MG tablet Take 1 tablet (5 mg total) by mouth 3 (three) times daily as needed. Reported on 01/11/2016 01/11/16   Midge Minium, MD  fluticasone [facility]) 50 MCG/ACT nasal spray Place 2 sprays into both nostrils daily. 12/06/15   Midge Minium, MD  naproxen (NAPROSYN) 500 MG tablet Take 500 mg by mouth every 12 (twelve) hours as needed. 11/09/15   [provider]  predniSONE (DELTASONE) 10 MG tablet 3 tabs x3 days and then 2 tabs x3 days and then 1 tab x3 days.  Take w/ food. 01/11/16   Midge Minium, MD  Prenatal Vit-Fe Fumarate-FA (PRENATAL MULTIVITAMIN) TABS Take 1 tablet by mouth daily. Reported on 12/06/2015    [provider]    Family History Family History  Problem Relation Age of Onset  . Hypertension Father     Social History Social History   Tobacco Use  . Smoking status: Former Smoker  Substance Use Topics  . Alcohol use: No  . Drug use: No     Allergies   Patient has no known allergies.   Review of Systems Review of Systems  Constitutional: Negative for activity change.  Gastrointestinal: Negative for nausea and vomiting.  Neurological: Positive for headaches. Negative for  dizziness, seizures, speech difficulty and numbness.  Hematological: Does not bruise/bleed easily.     Physical Exam Updated Vital Signs BP (!) 137/104   Pulse 93   Temp 98 F (36.7 C) (Oral)   Resp 18   Ht 5\' 10"  (1.778 m)   Wt 89.4 kg   LMP 07/25/2018 (Approximate)   SpO2 100%   BMI 28.27 kg/m   Physical Exam  Constitutional: She is oriented to person, place, and time. She appears well-developed.  HENT:  Head: Normocephalic and atraumatic.  Eyes: EOM are normal.  Neck: Normal range of motion. Neck supple.  No midline c-spine tenderness, pt able to turn head to 45 degrees bilaterally without any pain  and able to flex neck to the chest and extend without any pain or neurologic symptoms.   Cardiovascular: Normal rate.  Pulmonary/Chest: Effort normal.  Abdominal: Bowel sounds are normal.  Neurological: She is alert and oriented to person, place, and time. No cranial nerve deficit.  Skin: Skin is warm and dry.  Nursing note and vitals reviewed.    ED Treatments / Results  Labs (all labs ordered are listed, but only abnormal results are displayed) Labs Reviewed - No data to display  EKG None  Radiology No results found.  Procedures Procedures (including critical care time)  Medications Ordered in ED Medications - No data to display   Initial Impression / Assessment and Plan / ED Course  I have reviewed the triage vital signs and the nursing notes.  Pertinent labs & imaging results that were available during my care of the patient were reviewed by me and considered in my medical decision making (see chart for details).     42 year old female comes in with chief complaint of head injury.  She had blunt trauma to the top of her head yesterday.  It appears that there was no loss of consciousness and there are no red flags that are concerning for elevated intracranial pressure.  Patient is having headache, however now more than 12 hours past the injury, suspicion for clinically significant intracranial bleed is extremely low in a patient was not on any blood thinners.  I suspect that patient might be having a little bit of a postconcussion syndrome.  She has no neurologic deficits and on our exam there is no focal C-spine tenderness either.  Plan is for patient to manage her symptoms conservatively.  She will return to the ER if her symptoms get worse or she starts developing new neurologic symptoms.  Final Clinical Impressions(s) / ED Diagnoses   Final diagnoses:  Minor head injury, initial encounter  Post concussion syndrome    ED Discharge Orders    None         Varney Biles, MD 08/08/18 1236

## 2018-08-08 NOTE — Discharge Instructions (Signed)
We suspect that you might have suffered from contusion or postconcussion syndrome. Based on her history and exam, we are not concerned for brain bleed or injury to your cervical spine.  Please take over-the-counter ibuprofen or Tylenol for proper relief.

## 2018-08-08 NOTE — ED Triage Notes (Signed)
Reports holding plywood over her head yesterday and hitting her head.  Denies LOC.  Reports headache after event which resolved.

## 2018-10-04 ENCOUNTER — Encounter: Payer: Self-pay | Admitting: Family Medicine

## 2018-10-04 ENCOUNTER — Ambulatory Visit: Payer: BLUE CROSS/BLUE SHIELD | Admitting: Family Medicine

## 2018-10-04 ENCOUNTER — Other Ambulatory Visit: Payer: Self-pay

## 2018-10-04 VITALS — BP 123/81 | HR 78 | Temp 98.1°F | Resp 17 | Ht 71.0 in | Wt 207.1 lb

## 2018-10-04 DIAGNOSIS — K5900 Constipation, unspecified: Secondary | ICD-10-CM | POA: Diagnosis not present

## 2018-10-04 DIAGNOSIS — R1011 Right upper quadrant pain: Secondary | ICD-10-CM

## 2018-10-04 DIAGNOSIS — B359 Dermatophytosis, unspecified: Secondary | ICD-10-CM

## 2018-10-04 MED ORDER — POLYETHYLENE GLYCOL 3350 17 GM/SCOOP PO POWD: 17.0000 g | Freq: Every day | ORAL | 1 refills | Status: DC

## 2018-10-04 MED ORDER — CLOTRIMAZOLE 1 % EX CREA: 1.0000 "application " | TOPICAL_CREAM | Freq: Two times a day (BID) | CUTANEOUS | 0 refills | Status: DC

## 2018-10-04 NOTE — Patient Instructions (Addendum)
Schedule your complete physical in 3-4 months START the Miralax once daily.  17 grams- 1 capful- in 8 oz of liquid.  (You can compare the prescription cost vs the over the counter cost) Make sure you are drinking plenty of water and making healthy food choices- this helps your bowel habits Regular exercise also improves your bowel habits APPLY the Clotrimazole cream twice daily on the ring worm If the abdominal pain continues or worsens- please let me know! Call with any questions or concerns Happy New Year!

## 2018-10-04 NOTE — Progress Notes (Signed)
   Subjective:    Patient ID: Denise White, female    DOB: 12-22-1975, 42 y.o.   MRN: [id number]  HPI Abd pain- pt reports she is under a lot of stress recently.  Pt felt some R sided distension and discomfort 'when the stress really hits'.  Constipation x2-3 weeks- has hx of using 'smooth move' herbal tea to move bowels.  Distension improves s/p BM.  Pt reports she is not having pain today.  Last BM 2 nights ago.  Ringworm- R thigh.  dx'd as Ringworm, completed 14 day treatment (topical) but area remains.   Review of Systems For ROS see HPI     Objective:   Physical Exam Vitals signs reviewed.  Constitutional:      General: She is not in acute distress.    Appearance: She is obese.  HENT:     Head: Normocephalic and atraumatic.  Cardiovascular:     Rate and Rhythm: Normal rate and regular rhythm.  Pulmonary:     Effort: Pulmonary effort is normal.     Breath sounds: Normal breath sounds.  Abdominal:     General: Abdomen is flat. A surgical scar is present. Bowel sounds are normal. There is no distension.     Palpations: Abdomen is soft. There is no hepatomegaly, mass or pulsatile mass.     Tenderness: There is no abdominal tenderness. There is no guarding or rebound.  Skin:    General: Skin is warm and dry.     Comments: Tinea of R lateral thigh  Neurological:     General: No focal deficit present.     Mental Status: She is alert and oriented to person, place, and time.  Psychiatric:     Comments: anxious           Assessment & Plan:  Constipation- new to provider, ongoing for pt.  Encouraged daily use of Miralax until bowels are regular.  Discussed dietary and lifestyle modifications that will improve sxs.  Pt expressed understanding and is in agreement w/ plan.   Ringworm- discussed w/ pt that resolution of sxs can takes weeks to months and it will not resolve after 14 days.  Refill provided and pt to continue tx.  RUQ pain- new.  No pain on PE today.  Suspect  a component of IBS as sxs only occur w/ stress.  Discussed dietary and lifestyle modifications that can improve sxs.  Will follow.

## 2018-10-13 ENCOUNTER — Other Ambulatory Visit: Payer: Self-pay

## 2018-10-13 ENCOUNTER — Encounter: Payer: Self-pay | Admitting: Family Medicine

## 2018-10-13 ENCOUNTER — Encounter

## 2018-10-13 ENCOUNTER — Ambulatory Visit: Payer: BLUE CROSS/BLUE SHIELD | Admitting: Family Medicine

## 2018-10-13 VITALS — BP 124/85 | HR 85 | Temp 98.0°F | Resp 16 | Ht 71.0 in | Wt 205.1 lb

## 2018-10-13 DIAGNOSIS — J309 Allergic rhinitis, unspecified: Secondary | ICD-10-CM | POA: Diagnosis not present

## 2018-10-13 MED ORDER — FLUTICASONE PROPIONATE 50 MCG/ACT NA SUSP: 2.0000 | Freq: Every day | NASAL | 6 refills | Status: DC

## 2018-10-13 MED ORDER — CETIRIZINE HCL 10 MG PO TABS: 10.0000 mg | ORAL_TABLET | Freq: Every day | ORAL | 11 refills | Status: DC

## 2018-10-13 NOTE — Progress Notes (Signed)
   Subjective:    Patient ID: Denise White, female    DOB: Feb 19, 1976, 43 y.o.   MRN: [id number]  HPI URI- 'my sinuses.  i've never had it this bad'.  Has tried Benadryl, Sudafed.  Facial pain and pressure is causing eye pain and 'strain'.  + facial swelling.  + HA.  No tooth pain.  R side > L.  sxs started ~5 months ago but acutely worsened a few weeks ago.   Review of Systems For ROS see HPI     Objective:   Physical Exam Vitals signs reviewed.  Constitutional:      General: She is not in acute distress.    Appearance: She is well-developed.  HENT:     Head: Normocephalic and atraumatic.     Right Ear: Tympanic membrane normal.     Left Ear: Tympanic membrane normal.     Nose: Mucosal edema and rhinorrhea present.     Right Sinus: No maxillary sinus tenderness or frontal sinus tenderness.     Left Sinus: No maxillary sinus tenderness or frontal sinus tenderness.     Mouth/Throat:     Pharynx: Posterior oropharyngeal erythema (w/ PND) present.  Eyes:     Conjunctiva/sclera: Conjunctivae normal.     Pupils: Pupils are equal, round, and reactive to light.  Neck:     Musculoskeletal: Normal range of motion and neck supple.  Cardiovascular:     Rate and Rhythm: Normal rate and regular rhythm.     Heart sounds: Normal heart sounds.  Pulmonary:     Effort: Pulmonary effort is normal. No respiratory distress.     Breath sounds: Normal breath sounds. No wheezing or rales.  Lymphadenopathy:     Cervical: No cervical adenopathy.           Assessment & Plan:  Allergic rhinitis- new.  No evidence of infxn- no need for abx.  + sinus inflammation.  Start Zyrtec, Flonase.  Reviewed supportive care and red flags that should prompt return.  Pt expressed understanding and is in agreement w/ plan.

## 2018-10-13 NOTE — Patient Instructions (Signed)
Follow up as needed or as scheduled START the Cetirizine daily to decease sinus inflammation and pressure START the Fluticasone nasal spray- 2 sprays each nostril daily Drink plenty of fluids Ibuprofen as needed- take w/ food Drink plenty of fluids REST! Call with any questions or concerns Hang in there!

## 2018-11-29 ENCOUNTER — Ambulatory Visit: Payer: BLUE CROSS/BLUE SHIELD | Admitting: Physician Assistant

## 2018-11-29 ENCOUNTER — Encounter: Payer: Self-pay | Admitting: Physician Assistant

## 2018-11-29 ENCOUNTER — Other Ambulatory Visit: Payer: Self-pay

## 2018-11-29 VITALS — BP 112/80 | HR 87 | Temp 98.1°F | Resp 14 | Ht 71.0 in | Wt 204.0 lb

## 2018-11-29 DIAGNOSIS — J019 Acute sinusitis, unspecified: Secondary | ICD-10-CM

## 2018-11-29 DIAGNOSIS — B9689 Other specified bacterial agents as the cause of diseases classified elsewhere: Secondary | ICD-10-CM

## 2018-11-29 MED ORDER — AMOXICILLIN-POT CLAVULANATE 875-125 MG PO TABS: 1.0000 | ORAL_TABLET | Freq: Two times a day (BID) | ORAL | 0 refills | Status: DC

## 2018-11-29 NOTE — Patient Instructions (Signed)
Please take antibiotic as directed.  Increase fluid intake.  Use Saline nasal spray.  Take a daily multivitamin. Continue Flonase and Zyrtec.  Place a humidifier in the bedroom.  Please call or return clinic if symptoms are not improving.  Sinusitis Sinusitis is redness, soreness, and swelling (inflammation) of the paranasal sinuses. Paranasal sinuses are air pockets within the bones of your face (beneath the eyes, the middle of the forehead, or above the eyes). In healthy paranasal sinuses, mucus is able to drain out, and air is able to circulate through them by way of your nose. However, when your paranasal sinuses are inflamed, mucus and air can become trapped. This can allow bacteria and other germs to grow and cause infection. Sinusitis can develop quickly and last only a short time (acute) or continue over a long period (chronic). Sinusitis that lasts for more than 12 weeks is considered chronic.  CAUSES  Causes of sinusitis include:  Allergies.  Structural abnormalities, such as displacement of the cartilage that separates your nostrils (deviated septum), which can decrease the air flow through your nose and sinuses and affect sinus drainage.  Functional abnormalities, such as when the small hairs (cilia) that line your sinuses and help remove mucus do not work properly or are not present. SYMPTOMS  Symptoms of acute and chronic sinusitis are the same. The primary symptoms are pain and pressure around the affected sinuses. Other symptoms include:  Upper toothache.  Earache.  Headache.  Bad breath.  Decreased sense of smell and taste.  A cough, which worsens when you are lying flat.  Fatigue.  Fever.  Thick drainage from your nose, which often is green and may contain pus (purulent).  Swelling and warmth over the affected sinuses. DIAGNOSIS  Your caregiver will perform a physical exam. During the exam, your caregiver may:  Look in your nose for signs of abnormal growths in  your nostrils (nasal polyps).  Tap over the affected sinus to check for signs of infection.  View the inside of your sinuses (endoscopy) with a special imaging device with a light attached (endoscope), which is inserted into your sinuses. If your caregiver suspects that you have chronic sinusitis, one or more of the following tests may be recommended:  Allergy tests.  Nasal culture A sample of mucus is taken from your nose and sent to a lab and screened for bacteria.  Nasal cytology A sample of mucus is taken from your nose and examined by your caregiver to determine if your sinusitis is related to an allergy. TREATMENT  Most cases of acute sinusitis are related to a viral infection and will resolve on their own within 10 days. Sometimes medicines are prescribed to help relieve symptoms (pain medicine, decongestants, nasal steroid sprays, or saline sprays).  However, for sinusitis related to a bacterial infection, your caregiver will prescribe antibiotic medicines. These are medicines that will help kill the bacteria causing the infection.  Rarely, sinusitis is caused by a fungal infection. In theses cases, your caregiver will prescribe antifungal medicine. For some cases of chronic sinusitis, surgery is needed. Generally, these are cases in which sinusitis recurs more than 3 times per year, despite other treatments. HOME CARE INSTRUCTIONS   Drink plenty of water. Water helps thin the mucus so your sinuses can drain more easily.  Use a humidifier.  Inhale steam 3 to 4 times a day (for example, sit in the bathroom with the shower running).  Apply a warm, moist washcloth to your face 3 to  4 times a day, or as directed by your caregiver.  Use saline nasal sprays to help moisten and clean your sinuses.  Take over-the-counter or prescription medicines for pain, discomfort, or fever only as directed by your caregiver. SEEK IMMEDIATE MEDICAL CARE IF:  You have increasing pain or severe  headaches.  You have nausea, vomiting, or drowsiness.  You have swelling around your face.  You have vision problems.  You have a stiff neck.  You have difficulty breathing. MAKE SURE YOU:   Understand these instructions.  Will watch your condition.  Will get help right away if you are not doing well or get worse. Document Released: 09/22/2005 Document Revised: 12/15/2011 Document Reviewed: 10/07/2011 [facility] Patient Information 2014 [place], [place].

## 2018-11-29 NOTE — Progress Notes (Signed)
Patient presents to clinic today c/o a couple of weeks of nasal congestion and PND, now with 3 days of sinus pain, headache, tooth pain and cough that is non-productive. Thick nasal discharge noted. Denies recent travel or sick contact. Has been using Flonase and Zyrtec. Marland Kitchen   Past Medical History:  Diagnosis Date  . Chicken pox   . Fibroid     Current Outpatient Medications on File Prior to Visit  Medication Sig Dispense Refill  . cetirizine (ZYRTEC) 10 MG tablet Take 1 tablet (10 mg total) by mouth daily. 30 tablet 11  . clotrimazole (LOTRIMIN) 1 % cream Apply 1 application topically 2 (two) times daily. 60 g 0  . fluticasone (FLONASE) 50 MCG/ACT nasal spray Place 2 sprays into both nostrils daily. 16 g 6  . polyethylene glycol powder (GLYCOLAX/MIRALAX) powder Take 17 g by mouth daily. 3350 g 1   No current facility-administered medications on file prior to visit.     No Known Allergies  Family History  Problem Relation Age of Onset  . Hypertension Father     Social History   Socioeconomic History  . Marital status: Single    Spouse name: Not on file  . Number of children: Not on file  . Years of education: Not on file  . Highest education level: Not on file  Occupational History  . Not on file  Social Needs  . Financial resource strain: Not on file  . Food insecurity:    Worry: Not on file    Inability: Not on file  . Transportation needs:    Medical: Not on file    Non-medical: Not on file  Tobacco Use  . Smoking status: Former Research scientist (life sciences)  . Smokeless tobacco: Never Used  Substance and Sexual Activity  . Alcohol use: No  . Drug use: No  . Sexual activity: Not on file  Lifestyle  . Physical activity:    Days per week: Not on file    Minutes per session: Not on file  . Stress: Not on file  Relationships  . Social connections:    Talks on phone: Not on file    Gets together: Not on file    Attends religious service: Not on file    Active member of club or  organization: Not on file    Attends meetings of clubs or organizations: Not on file    Relationship status: Not on file  Other Topics Concern  . Not on file  Social History Narrative  . Not on file   Review of Systems - See HPI.  All other ROS are negative.  BP 112/80   Pulse 87   Temp 98.1 F (36.7 C) (Oral)   Resp 14   Ht 5\' 11"  (1.803 m)   Wt 204 lb (92.5 kg)   SpO2 98%   BMI 28.45 kg/m   Physical Exam Vitals signs reviewed.  Constitutional:      Appearance: Normal appearance.  HENT:     Head: Normocephalic and atraumatic.     Right Ear: Tympanic membrane normal.     Left Ear: Tympanic membrane normal.     Nose: Congestion present.     Right Turbinates: Enlarged and swollen.     Left Turbinates: Enlarged and swollen.     Right Sinus: Frontal sinus tenderness present.     Left Sinus: Frontal sinus tenderness present.     Mouth/Throat:     Mouth: Mucous membranes are moist.  Neck:  Musculoskeletal: Neck supple.  Cardiovascular:     Rate and Rhythm: Normal rate and regular rhythm.  Pulmonary:     Effort: Pulmonary effort is normal.     Breath sounds: Normal breath sounds.  Neurological:     Mental Status: She is alert.    Assessment/Plan: 1. Acute bacterial sinusitis Rx Augmentin.  Increase fluids.  Rest.  Saline nasal spray.  Probiotic.  Mucinex as directed.  Humidifier in bedroom.  Call or return to clinic if symptoms are not improving.  - amoxicillin-clavulanate (AUGMENTIN) 875-125 MG tablet; Take 1 tablet by mouth 2 (two) times daily.  Dispense: 14 tablet; Refill: 0   Leeanne Rio, PA-C

## 2019-05-28 ENCOUNTER — Other Ambulatory Visit: Payer: Self-pay | Admitting: Family Medicine

## 2019-08-23 ENCOUNTER — Other Ambulatory Visit: Payer: Self-pay | Admitting: Family Medicine

## 2021-12-26 ENCOUNTER — Other Ambulatory Visit: Payer: Self-pay | Admitting: Orthopedic Surgery

## 2021-12-26 DIAGNOSIS — M24811 Other specific joint derangements of right shoulder, not elsewhere classified: Secondary | ICD-10-CM

## 2022-01-12 ENCOUNTER — Other Ambulatory Visit: Payer: BLUE CROSS/BLUE SHIELD

## 2022-01-13 ENCOUNTER — Other Ambulatory Visit: Payer: Managed Care, Other (non HMO)

## 2022-01-14 ENCOUNTER — Ambulatory Visit (INDEPENDENT_AMBULATORY_CARE_PROVIDER_SITE_OTHER): Payer: Managed Care, Other (non HMO)

## 2022-01-14 DIAGNOSIS — M24811 Other specific joint derangements of right shoulder, not elsewhere classified: Secondary | ICD-10-CM | POA: Diagnosis not present

## 2022-01-30 LAB — HM PAP SMEAR

## 2022-02-21 ENCOUNTER — Telehealth: Payer: Self-pay | Admitting: Family Medicine

## 2022-02-21 NOTE — Telephone Encounter (Signed)
Pt is aware and scheduled.

## 2022-02-21 NOTE — Telephone Encounter (Signed)
Pt called in asking to schedule a cpe/ bw with Tabori. Last visit with her was 10/13/18.   Please advise if pt can re-est.

## 2022-02-21 NOTE — Telephone Encounter (Signed)
Ok to re-establish 

## 2022-03-04 ENCOUNTER — Ambulatory Visit (INDEPENDENT_AMBULATORY_CARE_PROVIDER_SITE_OTHER): Payer: Commercial Managed Care - HMO | Admitting: Family Medicine

## 2022-03-04 ENCOUNTER — Encounter: Payer: Self-pay | Admitting: Family Medicine

## 2022-03-04 VITALS — BP 128/74 | HR 86 | Temp 98.0°F | Resp 16 | Ht 71.0 in | Wt 217.0 lb

## 2022-03-04 DIAGNOSIS — Z6830 Body mass index (BMI) 30.0-30.9, adult: Secondary | ICD-10-CM | POA: Diagnosis not present

## 2022-03-04 DIAGNOSIS — E669 Obesity, unspecified: Secondary | ICD-10-CM | POA: Diagnosis not present

## 2022-03-04 DIAGNOSIS — Z1211 Encounter for screening for malignant neoplasm of colon: Secondary | ICD-10-CM | POA: Diagnosis not present

## 2022-03-04 DIAGNOSIS — Z0000 Encounter for general adult medical examination without abnormal findings: Secondary | ICD-10-CM | POA: Diagnosis not present

## 2022-03-04 LAB — CBC WITH DIFFERENTIAL/PLATELET
Basophils Absolute: 0 10*3/uL (ref 0.0–0.1)
Basophils Relative: 0.7 % (ref 0.0–3.0)
Eosinophils Absolute: 0.2 10*3/uL (ref 0.0–0.7)
Eosinophils Relative: 2.9 % (ref 0.0–5.0)
HCT: 38.7 % (ref 36.0–46.0)
Hemoglobin: 12.5 g/dL (ref 12.0–15.0)
Lymphocytes Relative: 26.5 % (ref 12.0–46.0)
Lymphs Abs: 1.7 10*3/uL (ref 0.7–4.0)
MCHC: 32.3 g/dL (ref 30.0–36.0)
MCV: 88 fl (ref 78.0–100.0)
Monocytes Absolute: 0.7 10*3/uL (ref 0.1–1.0)
Monocytes Relative: 10.2 % (ref 3.0–12.0)
Neutro Abs: 3.8 10*3/uL (ref 1.4–7.7)
Neutrophils Relative %: 59.7 % (ref 43.0–77.0)
Platelets: 351 10*3/uL (ref 150.0–400.0)
RBC: 4.4 Mil/uL (ref 3.87–5.11)
RDW: 15 % (ref 11.5–15.5)
WBC: 6.4 10*3/uL (ref 4.0–10.5)

## 2022-03-04 LAB — HEPATIC FUNCTION PANEL
ALT: 41 U/L — ABNORMAL HIGH (ref 0–35)
AST: 29 U/L (ref 0–37)
Albumin: 4.3 g/dL (ref 3.5–5.2)
Alkaline Phosphatase: 98 U/L (ref 39–117)
Bilirubin, Direct: 0.1 mg/dL (ref 0.0–0.3)
Total Bilirubin: 0.5 mg/dL (ref 0.2–1.2)
Total Protein: 7.4 g/dL (ref 6.0–8.3)

## 2022-03-04 LAB — BASIC METABOLIC PANEL
BUN: 13 mg/dL (ref 6–23)
CO2: 27 mEq/L (ref 19–32)
Calcium: 9.7 mg/dL (ref 8.4–10.5)
Chloride: 104 mEq/L (ref 96–112)
Creatinine, Ser: 0.98 mg/dL (ref 0.40–1.20)
GFR: 69.78 mL/min (ref >60.00)
Glucose, Bld: 91 mg/dL (ref 70–99)
Potassium: 4.4 mEq/L (ref 3.5–5.1)
Sodium: 139 mEq/L (ref 135–145)

## 2022-03-04 LAB — LIPID PANEL
Cholesterol: 237 mg/dL — ABNORMAL HIGH (ref 0–200)
HDL: 46.4 mg/dL (ref >39.00)
NonHDL: 190.35
Total CHOL/HDL Ratio: 5
Triglycerides: 225 mg/dL — ABNORMAL HIGH (ref 0.0–149.0)
VLDL: 45 mg/dL — ABNORMAL HIGH (ref 0.0–40.0)

## 2022-03-04 LAB — TSH: TSH: 1.53 u[IU]/mL (ref 0.35–5.50)

## 2022-03-04 LAB — LDL CHOLESTEROL, DIRECT: Direct LDL: 153 mg/dL

## 2022-03-04 LAB — VITAMIN D 25 HYDROXY (VIT D DEFICIENCY, FRACTURES): VITD: 14.84 ng/mL — ABNORMAL LOW (ref 30.00–100.00)

## 2022-03-04 NOTE — Patient Instructions (Signed)
Follow up in 1 year or as needed We'll notify you of your lab results and make any changes if needed Continue to work on healthy diet and regular exercise- you can do it! Call with any questions or concerns Stay Safe!  Stay Healthy! Have a great summer!!!

## 2022-03-04 NOTE — Progress Notes (Signed)
   Subjective:    Patient ID: Denise White, female    DOB: 07/16/1976, 46 y.o.   MRN: [id number]  HPI Re-establish care.  Pt desires CPE.  UTD on pap, mammo (01/23/22).  Due for colonoscopy- GYN referred  Health Maintenance  Topic Date Due   COVID-19 Vaccine (1) Never done   PAP SMEAR-Modifier  03/04/2022 (Originally 10/05/2015)   COLONOSCOPY (Pts 45-68yr Insurance coverage will need to be confirmed)  03/05/2023 (Originally 10/24/2021)   TETANUS/TDAP  03/05/2023 (Originally 10/25/1995)   Hepatitis C Screening  03/05/2023 (Originally 10/24/1994)   HIV Screening  03/05/2023 (Originally 10/25/1991)   INFLUENZA VACCINE  05/06/2022   HPV VACCINES  Aged Out      Review of Systems Patient reports no vision/ hearing changes, adenopathy,fever, weight change,  persistant/recurrent hoarseness , swallowing issues, chest pain, palpitations, edema, persistant/recurrent cough, hemoptysis, dyspnea (rest/exertional/paroxysmal nocturnal), gastrointestinal bleeding (melena, rectal bleeding), abdominal pain, significant heartburn, bowel changes, GU symptoms (dysuria, hematuria, incontinence), Gyn symptoms (abnormal  bleeding, pain),  syncope, focal weakness, memory loss, numbness & tingling, skin/hair/nail changes, abnormal bruising or bleeding, anxiety, or depression.     Objective:   Physical Exam General Appearance:    Alert, cooperative, no distress, appears stated age  Head:    Normocephalic, without obvious abnormality, atraumatic  Eyes:    PERRL, conjunctiva/corneas clear, EOM's intact both eyes  Ears:    Normal TM's and external ear canals, both ears  Nose:   Nares normal, septum midline, mucosa normal, no drainage    or sinus tenderness  Throat:   Lips, mucosa, and tongue normal; teeth and gums normal  Neck:   Supple, symmetrical, trachea midline, no adenopathy;    Thyroid: no enlargement/tenderness/nodules  Back:     Symmetric, no curvature, ROM normal, no CVA tenderness  Lungs:     Clear  to auscultation bilaterally, respirations unlabored  Chest Wall:    No tenderness or deformity   Heart:    Regular rate and rhythm, S1 and S2 normal, no murmur, rub   or gallop  Breast Exam:    Deferred to GYN  Abdomen:     Soft, non-tender, bowel sounds active all four quadrants,    no masses, no organomegaly  Genitalia:    Deferred to GYN  Rectal:    Extremities:   Extremities normal, atraumatic, no cyanosis or edema  Pulses:   2+ and symmetric all extremities  Skin:   Skin color, texture, turgor normal, no rashes or lesions  Lymph nodes:   Cervical, supraclavicular, and axillary nodes normal  Neurologic:   CNII-XII intact, normal strength, sensation and reflexes    throughout          Assessment & Plan:

## 2022-03-04 NOTE — Assessment & Plan Note (Signed)
Pt's BMI 30.27  Discussed need for healthy diet and regular exercise.  Check labs to risk stratify.  Will follow.

## 2022-03-04 NOTE — Assessment & Plan Note (Signed)
Pt's PE WNL w/ exception of obesity.  UTD on pap, mammo.  Has referral for colonoscopy from GYN.  Check labs.  Anticipatory guidance provided.

## 2022-03-07 MED ORDER — VITAMIN D (ERGOCALCIFEROL) 1.25 MG (50000 UNIT) PO CAPS: 50000.0000 [IU] | ORAL_CAPSULE | ORAL | 0 refills | Status: DC

## 2022-03-07 NOTE — Addendum Note (Signed)
Addended by: Patrcia Dolly on: 03/07/2022 02:55 PM   Modules accepted: Orders

## 2022-08-27 ENCOUNTER — Telehealth: Payer: Self-pay | Admitting: Family Medicine

## 2022-08-27 NOTE — Telephone Encounter (Signed)
Caller name: Tsuruko Murtha  On DPR?: Yes  Call back number: [phone] (mobile)  Provider they see: Midge Minium, MD  Reason for call: Juluis Rainier) scheduled pt for a lab visit on 09/02/2022. Pt want to get her insulin resistance and A1c checked.

## 2022-08-28 NOTE — Telephone Encounter (Signed)
Is it ok to place these labs in ?

## 2022-08-31 NOTE — Telephone Encounter (Signed)
I would prefer she have an office visit to talk about why she is concerned and we can order any appropriate labs after our discussion

## 2022-09-01 NOTE — Telephone Encounter (Signed)
Called and talked with patient she understands and is going to call back to schedule

## 2022-09-02 ENCOUNTER — Other Ambulatory Visit: Payer: Commercial Managed Care - HMO

## 2022-11-10 ENCOUNTER — Ambulatory Visit (INDEPENDENT_AMBULATORY_CARE_PROVIDER_SITE_OTHER): Payer: Self-pay | Admitting: Family Medicine

## 2022-11-10 ENCOUNTER — Encounter: Payer: Self-pay | Admitting: Family Medicine

## 2022-11-10 VITALS — BP 112/68 | HR 83 | Temp 97.9°F | Resp 17 | Ht 71.0 in | Wt 196.5 lb

## 2022-11-10 DIAGNOSIS — E669 Obesity, unspecified: Secondary | ICD-10-CM

## 2022-11-10 DIAGNOSIS — F4321 Adjustment disorder with depressed mood: Secondary | ICD-10-CM

## 2022-11-10 MED ORDER — TIRZEPATIDE 7.5 MG/0.5ML ~~LOC~~ SOAJ: 7.5000 mg | SUBCUTANEOUS | 0 refills | Status: DC

## 2022-11-10 MED ORDER — TRAZODONE HCL 50 MG PO TABS: 25.0000 mg | ORAL_TABLET | Freq: Every evening | ORAL | 3 refills | Status: DC | PRN

## 2022-11-10 NOTE — Patient Instructions (Signed)
Follow up in 3-4 weeks to recheck mood and sleep I sent the [facility] to your pharmacy Keep up the good work on healthy diet and regular exercise- you look AMAZING!! Take the Trazodone nightly to help w/ sleep.  Start w/ 1/2 tab and increase to 1 tab if needed Call with any questions or concerns Let yourself feel and grief and laugh and live!

## 2022-11-10 NOTE — Progress Notes (Unsigned)
   Subjective:    Patient ID: Denise White, female    DOB: [birth year], 47 y.o.   MRN: [id number]  HPI Obesity- pt is down 20 lbs since May.  Pt has been using Mounjaro.  She has been getting from a compounding pharmacy and wants [place] to assume prescribing.  After next injection she will go up to 7.'5mg'$  daily.  Denies CP, SOB, HA's, abd pain, N/V.  Grief- since last visit, she has lost her father and 2 sisters.  Pt finds that her anxiety has been high.  At night has a hard time slowing her thoughts.  Now looking for home that will accommodate her family and her mother.   Review of Systems For ROS see HPI     Objective:   Physical Exam Vitals reviewed.  Constitutional:      General: She is not in acute distress.    Appearance: Normal appearance. She is not ill-appearing.  HENT:     Head: Normocephalic and atraumatic.  Eyes:     Extraocular Movements: Extraocular movements intact.     Conjunctiva/sclera: Conjunctivae normal.  Cardiovascular:     Rate and Rhythm: Normal rate and regular rhythm.  Pulmonary:     Effort: Pulmonary effort is normal. No respiratory distress.  Skin:    General: Skin is warm and dry.  Neurological:     General: No focal deficit present.     Mental Status: She is alert and oriented to person, place, and time.  Psychiatric:        Mood and Affect: Mood normal.        Behavior: Behavior normal.        Thought Content: Thought content normal.           Assessment & Plan:

## 2022-11-11 NOTE — Assessment & Plan Note (Signed)
New.  Unfortunately pt lost her father and 2 sisters within a 4 month span.  She is understandably distraught and her anxiety level has been high.  Her biggest issue is w/ sleep.  Will start Trazodone nightly to help w/ sleep as she navigates her grief.  Pt expressed understanding and is in agreement w/ plan.

## 2022-11-11 NOTE — Assessment & Plan Note (Signed)
Pt is down 20 lbs since May!  She has been using Mounjaro and would like [place] to assume prescribing responsibility for this.  She is currently tolerating w/o difficulty.  After next week's injxn she is scheduled to increase to 7.'5mg'$  weekly.  New prescription sent.

## 2022-12-08 ENCOUNTER — Encounter: Payer: Self-pay | Admitting: Family Medicine

## 2022-12-08 ENCOUNTER — Ambulatory Visit: Payer: 59 | Admitting: Family Medicine

## 2022-12-08 VITALS — BP 110/80 | HR 89 | Temp 98.0°F | Resp 18 | Ht 71.0 in | Wt 193.0 lb

## 2022-12-08 DIAGNOSIS — G2581 Restless legs syndrome: Secondary | ICD-10-CM | POA: Diagnosis not present

## 2022-12-08 DIAGNOSIS — F4321 Adjustment disorder with depressed mood: Secondary | ICD-10-CM

## 2022-12-08 MED ORDER — TRAZODONE HCL 100 MG PO TABS: 100.0000 mg | ORAL_TABLET | Freq: Every day | ORAL | 3 refills | Status: DC

## 2022-12-08 NOTE — Patient Instructions (Signed)
Follow up in 1 month to recheck sleep and restless leg symptoms INCREASE the Trazodone to '100mg'$  nightly- 2 of what you have at home and 1 of the new prescription Keep talking, keep feeling!!!  You're doing great! IF the restless leg doesn't improve w/ the higher dose of Trazodone we will definitely make changes and adjust meds at next appt Call with any questions or concerns Stay Safe!  Stay Healthy!!

## 2022-12-08 NOTE — Progress Notes (Signed)
   Subjective:    Patient ID: Denise White, female    DOB: May 07, 1976, 47 y.o.   MRN: [id number]  HPI Grief- at last visit pt was having a hard time sleeping since losing her father and 2 sisters.  Was started on Trazodone nightly.  Pt reports it still takes 2-3 hrs to fall asleep.  Pt is also having early morning wakening.  Pt says that medication was initially helpful but then plateau'd.  Pt is experiencing some RLS- has the need to move, 'i feel anxious'.     Review of Systems For ROS see HPI     Objective:   Physical Exam Vitals reviewed.  Constitutional:      General: She is not in acute distress.    Appearance: Normal appearance. She is not ill-appearing.  HENT:     Head: Normocephalic and atraumatic.  Eyes:     Extraocular Movements: Extraocular movements intact.     Conjunctiva/sclera: Conjunctivae normal.     Pupils: Pupils are equal, round, and reactive to light.  Skin:    General: Skin is warm and dry.  Neurological:     General: No focal deficit present.     Mental Status: She is alert and oriented to person, place, and time.  Psychiatric:        Mood and Affect: Mood normal.        Behavior: Behavior normal.        Thought Content: Thought content normal.           Assessment & Plan:

## 2022-12-08 NOTE — Assessment & Plan Note (Signed)
Ongoing.  Pt reports sleep initially improved w/ the addition of Trazodone but then efficacy plateau'ed.  Now taking 3-4 hrs to fall asleep again.  Also having some RLS sxs- these predated the start of Trazodone.  Will increase Trazodone to '100mg'$  nightly and monitor for improvement.  If sxs are not getting better and she is still struggling w/ sleep will consider adding low dose Benzo.

## 2022-12-24 ENCOUNTER — Telehealth: Payer: Self-pay | Admitting: Family Medicine

## 2022-12-24 NOTE — Telephone Encounter (Signed)
I have called and left the pt a VM to call me back . Spoke to [employer] at [employer]  [phone]) AND she states the pt Is not showing any pharmacy benefits thru them . We do not have  a new insurance card to call to check .

## 2022-12-24 NOTE — Telephone Encounter (Signed)
Caller name: Chryl Wayson  On DPR?: Yes  Call back number: [phone] (home)  Provider they see: Midge Minium, MD  Reason for call: The compound is up to 10.

## 2022-12-24 NOTE — Telephone Encounter (Signed)
Patient called stating that she spoke with [employer] about her Mounjaro 7.5 mg/0.4ml. Patient states that [employer] need confirmation from Dr.Tabori that Denise White has tried [employer]. [employer] accept verbal: [phone] option 4 , fax number is [phone] or cover my meds.

## 2022-12-25 NOTE — Telephone Encounter (Signed)
Spoke w/ pt and she is emailing a copy of her insurance card so we can obtain a PA for [employer]

## 2022-12-25 NOTE — Telephone Encounter (Signed)
Pt returned my call . She is sending [place] a new copy of her insurance card

## 2022-12-26 NOTE — Telephone Encounter (Signed)
Still have not received the new card at this time

## 2023-01-06 ENCOUNTER — Encounter: Payer: Self-pay | Admitting: Family Medicine

## 2023-01-06 ENCOUNTER — Ambulatory Visit (INDEPENDENT_AMBULATORY_CARE_PROVIDER_SITE_OTHER): Payer: 59 | Admitting: Family Medicine

## 2023-01-06 VITALS — BP 118/80 | HR 83 | Temp 97.8°F | Resp 16 | Ht 71.0 in | Wt 189.5 lb

## 2023-01-06 DIAGNOSIS — F4321 Adjustment disorder with depressed mood: Secondary | ICD-10-CM

## 2023-01-06 DIAGNOSIS — B001 Herpesviral vesicular dermatitis: Secondary | ICD-10-CM

## 2023-01-06 MED ORDER — VALACYCLOVIR HCL 1 G PO TABS: 2000.0000 mg | ORAL_TABLET | Freq: Two times a day (BID) | ORAL | 0 refills | Status: AC

## 2023-01-06 NOTE — Patient Instructions (Signed)
Follow up as needed or as scheduled START the Valtrex x2 doses to help w/ the cold sore.  Save the remaining medication for future outbreaks Call with any questions or concerns I'm so proud of you and the progress you're making!!!

## 2023-01-06 NOTE — Assessment & Plan Note (Signed)
New.  Pt has hx of similar.  Likely triggered by recent viral URI.  No evidence of bacterial infxn on PE.  Start Valtrex to improve sxs.  Pt expressed understanding and is in agreement w/ plan.

## 2023-01-06 NOTE — Progress Notes (Signed)
   Subjective:    Patient ID: Denise White, female    DOB: [birth year], 47 y.o.   MRN: [id number]  HPI Grief- at last visit Trazodone was increased to 100mg  nightly.  [birth year]31 was dad's birthday- 1st since his passing.  'i got through it'.  Pt is now able to remember and laugh and smile.  She is crying less.  Sleep is improving.  Cold sore- new.  On upper lip.  First noticed 3 days ago.  Denies fevers/chills.  Recent URI w/ congestion, PND, cough.   Review of Systems For ROS see HPI     Objective:   Physical Exam Vitals reviewed.  Constitutional:      General: She is not in acute distress.    Appearance: Normal appearance. She is well-developed.  HENT:     Head: Normocephalic and atraumatic.     Right Ear: Tympanic membrane normal.     Left Ear: Tympanic membrane normal.     Nose: Mucosal edema and congestion present. No rhinorrhea.     Right Sinus: No maxillary sinus tenderness or frontal sinus tenderness.     Left Sinus: No maxillary sinus tenderness or frontal sinus tenderness.     Mouth/Throat:     Pharynx: Posterior oropharyngeal erythema (w/ PND) present.  Eyes:     Conjunctiva/sclera: Conjunctivae normal.     Pupils: Pupils are equal, round, and reactive to light.  Cardiovascular:     Rate and Rhythm: Normal rate and regular rhythm.     Heart sounds: Normal heart sounds.  Pulmonary:     Effort: Pulmonary effort is normal. No respiratory distress.     Breath sounds: Normal breath sounds. No wheezing or rales.  Musculoskeletal:     Cervical back: Normal range of motion and neck supple.  Lymphadenopathy:     Cervical: No cervical adenopathy.  Skin:    General: Skin is warm and dry.  Neurological:     General: No focal deficit present.     Mental Status: She is alert and oriented to person, place, and time.  Psychiatric:        Mood and Affect: Mood normal.        Behavior: Behavior normal.        Thought Content: Thought content normal.           Assessment  & Plan:

## 2023-01-06 NOTE — Assessment & Plan Note (Signed)
Improving w/ time and increased dose of Trazodone to help w/ sleep.  No changes at this time.  Will continue to follow.

## 2023-01-07 ENCOUNTER — Encounter: Payer: Self-pay | Admitting: Family Medicine

## 2023-01-07 MED ORDER — SEMAGLUTIDE (2 MG/DOSE) 8 MG/3ML ~~LOC~~ SOPN: 2.0000 mg | PEN_INJECTOR | SUBCUTANEOUS | 3 refills | Status: DC

## 2023-02-11 ENCOUNTER — Ambulatory Visit (INDEPENDENT_AMBULATORY_CARE_PROVIDER_SITE_OTHER): Payer: 59 | Admitting: Family Medicine

## 2023-02-11 ENCOUNTER — Ambulatory Visit: Payer: 59 | Admitting: Family Medicine

## 2023-02-11 ENCOUNTER — Telehealth: Payer: Self-pay

## 2023-02-11 ENCOUNTER — Encounter: Payer: Self-pay | Admitting: Family Medicine

## 2023-02-11 VITALS — BP 122/84 | HR 79 | Temp 98.2°F | Resp 17 | Ht 71.0 in | Wt 190.2 lb

## 2023-02-11 DIAGNOSIS — R0982 Postnasal drip: Secondary | ICD-10-CM | POA: Diagnosis not present

## 2023-02-11 MED ORDER — AZELASTINE HCL 0.1 % NA SOLN: 1.0000 | Freq: Two times a day (BID) | NASAL | 5 refills | Status: DC

## 2023-02-11 MED ORDER — LEVOCETIRIZINE DIHYDROCHLORIDE 5 MG PO TABS: 5.0000 mg | ORAL_TABLET | Freq: Every evening | ORAL | 3 refills | Status: DC

## 2023-02-11 NOTE — Telephone Encounter (Addendum)
Patient requesting labs  Iron TIBC iron binding and ferritin level Cholesterol Vitamin D and B12  Pt notes she forgot to ask for these to be tested today is it okay to order these and do a lab only visit?

## 2023-02-11 NOTE — Telephone Encounter (Signed)
I have advised the pt of Dr Beverely Low 's above message

## 2023-02-11 NOTE — Telephone Encounter (Signed)
She has an upcoming appt on 6/3.  We can discuss these labs (and others) at that visit

## 2023-02-11 NOTE — Patient Instructions (Addendum)
Follow up as needed or as scheduled START the daily Xyzal to help control seasonal allergies ADD the nasal spray- 1 spray twice in each nostril Drink plenty of fluids to rinse the back of your throat Call with any questions or concerns Hang in there!!!

## 2023-02-11 NOTE — Progress Notes (Signed)
   Subjective:    Patient ID: Denise White, female    DOB: Jul 09, 1976, 47 y.o.   MRN: [id number]  HPI Phlegm- pt reports '2 different kinds'.  States 1 is yellow and seems to be breaking up but 1 is clear.  Has intermittent hoarseness.  Ongoing cough.  Drinking hot tea.  Not currently on Claritin or Zyrtec despite hx of seasonal allergies.  + PND.     Review of Systems For ROS see HPI     Objective:   Physical Exam Vitals reviewed.  Constitutional:      General: She is not in acute distress.    Appearance: She is well-developed.  HENT:     Head: Normocephalic and atraumatic.     Right Ear: Tympanic membrane and ear canal normal.     Left Ear: Tympanic membrane and ear canal normal.     Nose: Mucosal edema and congestion present. No rhinorrhea.     Right Sinus: No maxillary sinus tenderness or frontal sinus tenderness.     Left Sinus: No maxillary sinus tenderness or frontal sinus tenderness.     Mouth/Throat:     Mouth: Mucous membranes are moist.     Pharynx: Posterior oropharyngeal erythema (w/ PND) present.  Eyes:     Conjunctiva/sclera: Conjunctivae normal.     Pupils: Pupils are equal, round, and reactive to light.  Cardiovascular:     Rate and Rhythm: Normal rate and regular rhythm.  Pulmonary:     Effort: Pulmonary effort is normal. No respiratory distress.  Musculoskeletal:     Cervical back: Normal range of motion and neck supple.  Lymphadenopathy:     Cervical: No cervical adenopathy.  Skin:    General: Skin is warm and dry.  Neurological:     General: No focal deficit present.     Mental Status: She is alert and oriented to person, place, and time.  Psychiatric:        Mood and Affect: Mood normal.        Behavior: Behavior normal.        Thought Content: Thought content normal.           Assessment & Plan:   Post nasal drip- deteriorated.  Pt has copious visible PND on exam.  Not currently on any allergy medication.  Will start oral  antihistamine and nasal spray.  Pt expressed understanding and is in agreement w/ plan.

## 2023-02-25 ENCOUNTER — Encounter: Payer: Self-pay | Admitting: Family Medicine

## 2023-03-09 ENCOUNTER — Encounter: Payer: Commercial Managed Care - HMO | Admitting: Family Medicine

## 2023-03-19 ENCOUNTER — Other Ambulatory Visit: Payer: Self-pay | Admitting: Family Medicine

## 2023-03-19 NOTE — Telephone Encounter (Signed)
Patient is requesting a refill of the following medications: Requested Prescriptions   Pending Prescriptions Disp Refills   traZODone (DESYREL) 50 MG tablet [Pharmacy Med Name: TRAZODONE 50 MG TABLET] 30 tablet 3    Sig: TAKE 1/2 TO 1 TABLET BY MOUTH AT BEDTIME AS NEEDED FOR SLEEP.    Date of patient request: 03/19/23 Last office visit: 02/11/23 Date of last refill: 12/08/22 Last refill amount: 30

## 2023-03-20 ENCOUNTER — Telehealth: Payer: Self-pay | Admitting: Family Medicine

## 2023-03-20 NOTE — Telephone Encounter (Signed)
Medical records has called in regards to our request to receive the patients last coloscopy from [facility]. States we should be able to see in EPIC if patient received it there. Wanting clarity, please advise.  Felicia, medical records dept. [phone]

## 2023-03-20 NOTE — Telephone Encounter (Signed)
Also not seen at [facility]

## 2023-03-20 NOTE — Telephone Encounter (Signed)
Called both practices and neither has a record of this patient being seen with them

## 2023-03-20 NOTE — Telephone Encounter (Signed)
There is no colonoscopy listed under procedures which means it was not done at [facility].  She likely had it done at [facility] and was mistaken as to the location

## 2023-03-24 ENCOUNTER — Telehealth: Payer: Self-pay

## 2023-03-24 NOTE — Telephone Encounter (Signed)
Original message : " Caller name: Laquila Huck   On DPR?: Yes   Call back number: [phone] (mobile)   Provider they see: Sheliah Hatch, MD   Reason for call:    Pt found Monjaro and needs a prescription from the nutrionist. She would like it to sent to her MyChart acct.    This was attached to an old note that is not related and should be in its own thread as I have created here.   I am confused by this message "she needs a prescription from the nutritionist" sounds like Nutritionist is prescribing, "would like it sent to her mychart account" If we are sending RX needs to be to a pharmacy I am failing to understand the send to mychart account.   Please provide clarification on this message

## 2023-03-24 NOTE — Telephone Encounter (Signed)
Please see phone note 03/24/23 for information about [facility] prescription.   Need to call patient and see if she can remember anything else about where she went for colonoscopy in order to obtain records

## 2023-03-24 NOTE — Telephone Encounter (Signed)
Caller name: Jennay Pivirotto  On DPR?: Yes  Call back number: [phone] (mobile)  Provider they see: Sheliah Hatch, MD  Reason for call:   Pt found Monjaro and needs a prescription from the nutrionist. She would like it to sent to her MyChart acct.

## 2023-03-24 NOTE — Telephone Encounter (Signed)
Pt states she is wanting Mounjaro 7.5 mg again

## 2023-03-24 NOTE — Telephone Encounter (Signed)
I have called and spoke to the pt which was a run around . She states she needs Monjaro sent to her my chart so she can send It to a nutritionist  at [facility] . The second part of the phone note stated we obtain her colonoscopy results pt has not had this done .Referral was sent to [facility] but not apt has been made at this time due to no results to obtain .

## 2023-03-24 NOTE — Telephone Encounter (Signed)
Please ask pt which dose of the [facility] she is requesting.  Currently we have her on Ozempic rather than Mounjaro.  When she last took [facility] the dose was 7.5mg  weekly.  Is this what she would like to restart?

## 2023-03-27 MED ORDER — TIRZEPATIDE 7.5 MG/0.5ML ~~LOC~~ SOAJ: 7.5000 mg | SUBCUTANEOUS | 1 refills | Status: AC

## 2023-03-27 NOTE — Telephone Encounter (Signed)
Prescription for Mounjaro 7.5mg sent to pharmacy.

## 2023-03-27 NOTE — Telephone Encounter (Signed)
Advised pt that the  Rx has been sent

## 2023-03-27 NOTE — Addendum Note (Signed)
Addended by: Sheliah Hatch on: 03/27/2023 03:50 PM   Modules accepted: Orders

## 2023-03-30 ENCOUNTER — Telehealth (INDEPENDENT_AMBULATORY_CARE_PROVIDER_SITE_OTHER): Payer: 59 | Admitting: Family Medicine

## 2023-03-30 ENCOUNTER — Encounter: Payer: Self-pay | Admitting: Family Medicine

## 2023-03-30 DIAGNOSIS — E669 Obesity, unspecified: Secondary | ICD-10-CM | POA: Diagnosis not present

## 2023-03-30 NOTE — Progress Notes (Signed)
   Virtual Visit via Video   I connected with patient on 03/30/23 at  9:20 AM EDT by a video enabled telemedicine application and verified that I am speaking with the correct person using two identifiers.  Location patient: Home Location provider: [place], Office Persons participating in the virtual visit: Patient, Provider, CMA Sheryle Hail C)  I discussed the limitations of evaluation and management by telemedicine and the availability of in person appointments. The patient expressed understanding and agreed to proceed.  Subjective:   HPI:   Medication issue- pt asked for refill on Mounjaro last week.  This was sent to local pharmacy.  She is saying the prescription needs to be uploaded to [facility] health.  She apparently has appt w/ them later today to get more instructions  ROS:   See pertinent positives and negatives per HPI.  Patient Active Problem List   Diagnosis Date Noted   Cold sore 01/06/2023   Grief 11/10/2022   Obesity (BMI 30-39.9) 03/04/2022   Acute radicular low back pain 01/11/2016   Post-nasal drip 12/06/2015   General medical examination 02/10/2012    Social History   Tobacco Use   Smoking status: Former   Smokeless tobacco: Never  Substance Use Topics   Alcohol use: Yes    Comment: rare    Current Outpatient Medications:    azelastine (ASTELIN) 0.1 % nasal spray, Place 1 spray into both nostrils 2 (two) times daily. Use in each nostril as directed, Disp: 30 mL, Rfl: 5   levocetirizine (XYZAL) 5 MG tablet, Take 1 tablet (5 mg total) by mouth every evening., Disp: 90 tablet, Rfl: 3   tirzepatide (MOUNJARO) 7.5 MG/0.5ML Pen, Inject 7.5 mg into the skin once a week., Disp: 6 mL, Rfl: 1   traZODone (DESYREL) 100 MG tablet, Take 1 tablet (100 mg total) by mouth at bedtime., Disp: 30 tablet, Rfl: 3   traZODone (DESYREL) 50 MG tablet, TAKE 1/2 TO 1 TABLET BY MOUTH AT BEDTIME AS NEEDED FOR SLEEP., Disp: 30 tablet, Rfl: 3  No Known  Allergies  Objective:   There were no vitals taken for this visit. AAOx3, NAD NCAT, EOMI No obvious CN deficits Coloring WNL Pt is able to speak clearly, coherently without shortness of breath or increased work of breathing.  Thought process is linear.  Mood is appropriate.  Assessment and Plan:   Obesity- ongoing issue for pt.  She has had issues getting her GLP1 due to supply issues and insurance changes.  Apparently [facility] has some way of providing this- not sure if it is compounded or not- and she will let me know when she has more information.  Prescription was sent last week.   Neena Rhymes, MD 03/30/2023

## 2023-03-31 ENCOUNTER — Encounter: Payer: Self-pay | Admitting: Family Medicine

## 2023-03-31 ENCOUNTER — Telehealth: Payer: Self-pay

## 2023-03-31 NOTE — Telephone Encounter (Signed)
Needs to try Bydurian pen or trulicity  Notes she has already tried Trulicity had negative side effects  Ozempic tried as well pt notes no side effects but not as effective as Mounjaro  Pt needs PA mounjaro as it is not preferred

## 2023-04-01 ENCOUNTER — Other Ambulatory Visit (HOSPITAL_COMMUNITY): Payer: Self-pay

## 2023-04-01 NOTE — Telephone Encounter (Signed)
She is attempting to get her medication through [facility] health.  At this time, no further action is needed

## 2023-04-01 NOTE — Telephone Encounter (Signed)
Mounjaro, Bydureon, and Trulicity is only approved for diagnosis of type 2 diabetes. No indication of diabetes in patient's chart. Zepbound and Reginal Lutes is FDA approved for weightloss, please advise.

## 2023-04-01 NOTE — Telephone Encounter (Signed)
Denise White CPHT states that only Wegovy and Zepbound ? Would you like to try one of these ?

## 2023-04-19 ENCOUNTER — Other Ambulatory Visit: Payer: Self-pay | Admitting: Family Medicine

## 2023-11-19 ENCOUNTER — Other Ambulatory Visit: Payer: Self-pay | Admitting: Family Medicine

## 2024-02-16 IMAGING — MR MR SHOULDER*R* W/O CM
5 series · 40 of 40 positions shown · non-contrast
Comparison: None.

CLINICAL DATA: Right shoulder pain.  Fall 11/18/2021.

EXAM:
MRI OF THE RIGHT SHOULDER WITHOUT CONTRAST
TECHNIQUE: Multiplanar, multisequence MR imaging of the shoulder was performed.
No intravenous contrast was administered.

[Series 3: T2 fat-sat · axial · 4.0mm · 0.62mm/px · z∈[-29,+91]mm · 8 of 27 slices shown (1 of 3)]
[im 1/27]
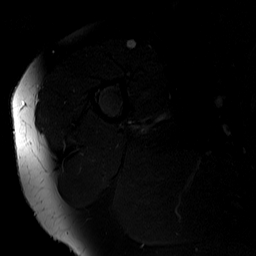
[im 4/27]
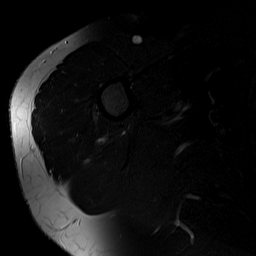
[im 8/27]
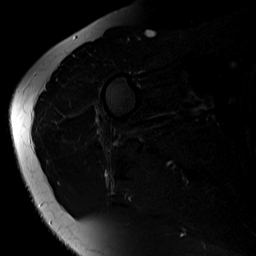
[im 12/27]
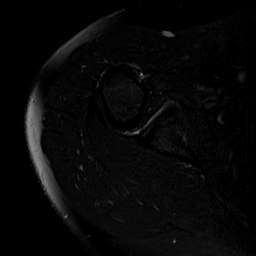
[im 15/27]
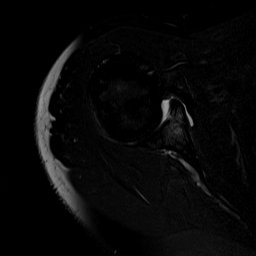
[im 19/27]
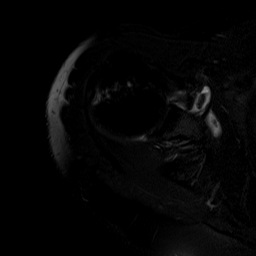
[im 23/27]
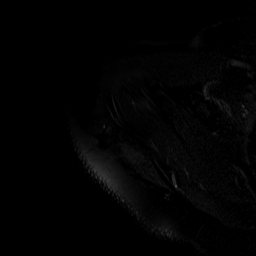
[im 27/27]
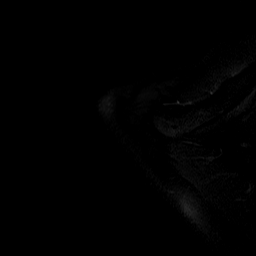

[Series 4: T2 fat-sat · oblique · 4.0mm · 0.62mm/px · 8 of 25 slices shown (2 of 3)]
[im 1/25]
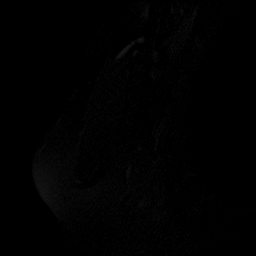
[im 4/25]
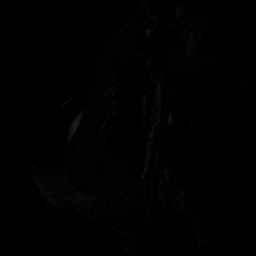
[im 7/25]
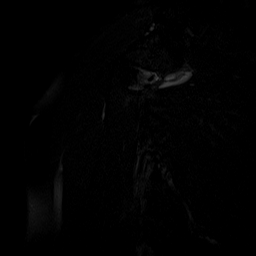
[im 11/25]
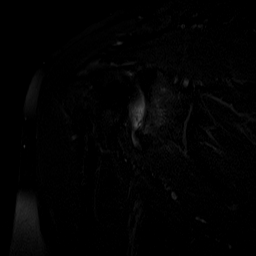
[im 14/25]
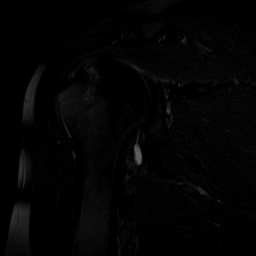
[im 18/25]
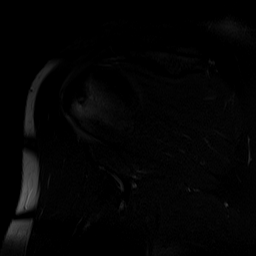
[im 21/25]
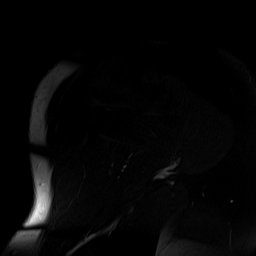
[im 25/25]
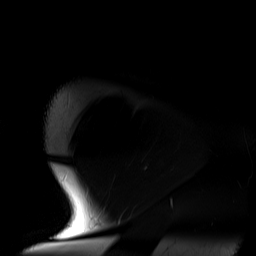

[Series 5: PD · oblique · 4.0mm · 0.62mm/px · 8 of 25 slices shown]
[im 1/25]
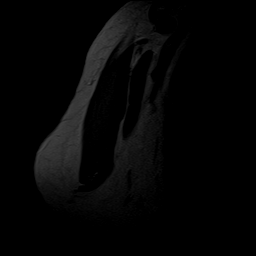
[im 4/25]
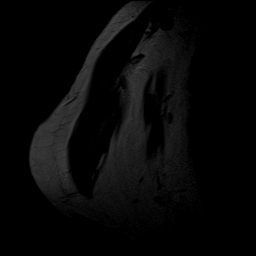
[im 7/25]
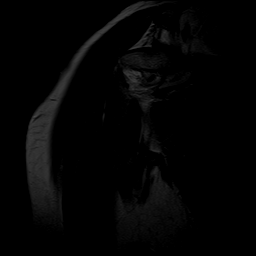
[im 11/25]
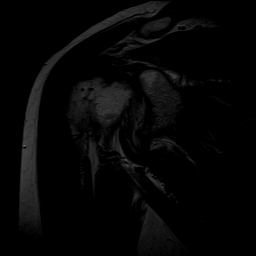
[im 14/25]
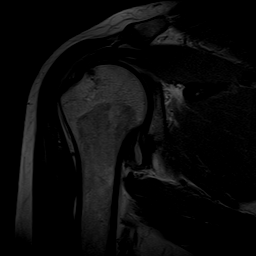
[im 18/25]
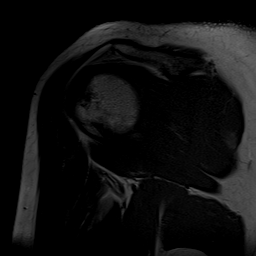
[im 21/25]
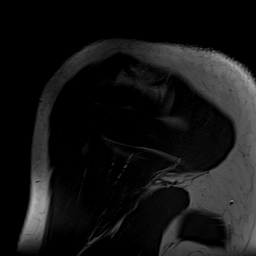
[im 25/25]
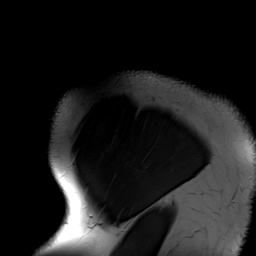

[Series 6: T2 fat-sat · oblique · 4.0mm · 0.62mm/px · 8 of 25 slices shown (3 of 3)]
[im 1/25]
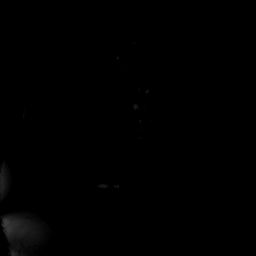
[im 4/25]
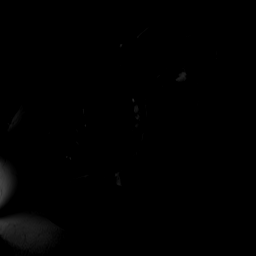
[im 7/25]
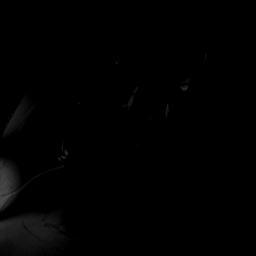
[im 11/25]
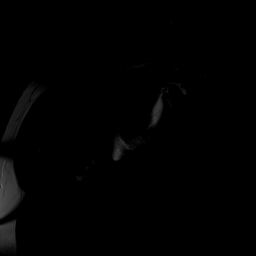
[im 14/25]
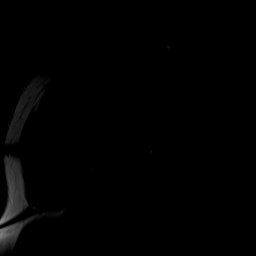
[im 18/25]
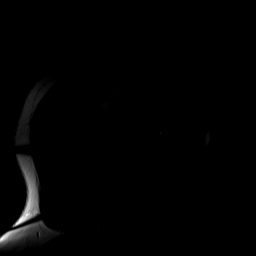
[im 21/25]
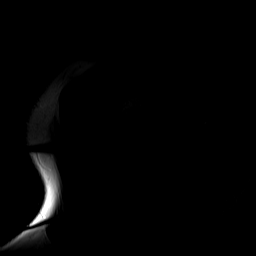
[im 25/25]
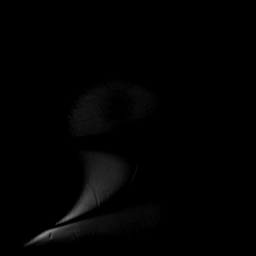

[Series 7: T1 · oblique · 4.0mm · 0.62mm/px · 8 of 25 slices shown]
[im 1/25]
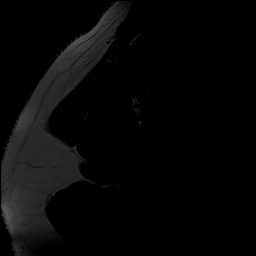
[im 4/25]
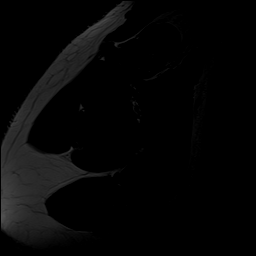
[im 7/25]
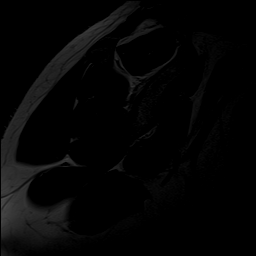
[im 11/25]
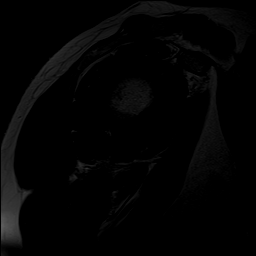
[im 14/25]
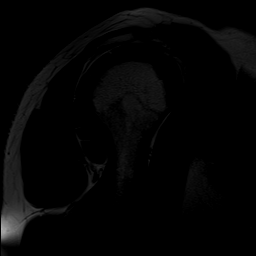
[im 18/25]
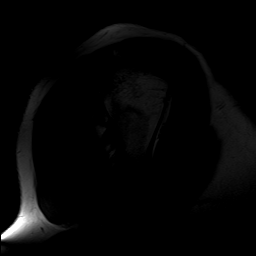
[im 21/25]
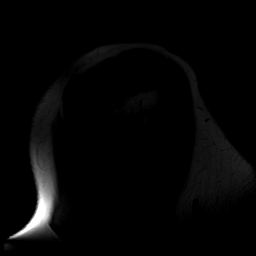
[im 25/25]
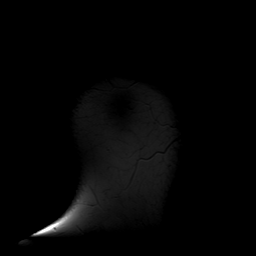

[40 of 40 positions shown; findings below may reference images not displayed]

FINDINGS: Rotator cuff: There is curvilinear borderline fluid bright increased
T2 signal within the midsubstance of the majority of the AP
dimension of the supraspinatus tendon in a region measuring up to
approximately 2 cm in AP dimension (sagittal series 6 images 18
through 20, coronal series 4 images ten through 14). This indicates
partial-thickness midsubstance insertional tears involving up to
approximately 40% of the transverse tendon dimension. At the
anterior aspect, this tear involves slightly more of the transverse
tendon dimension and extends through the articular tendon surface.
There is up to 1.3 cm retraction of the articular sided anterior
supraspinatus tendon footprint fibers (coronal image 11). The
infraspinatus, subscapularis, and teres minor are intact.

Muscles: No rotator cuff muscle atrophy, fatty infiltration, or
edema.

Biceps long head: The intra-articular long head of the biceps tendon
is intact.

Acromioclavicular Joint: There are minimal degenerative changes of
the acromioclavicular joint including joint space narrowing,
subchondral marrow edema, and peripheral osteophytosis. Type II
acromion. No subacromial/subdeltoid bursitis.

Glenohumeral Joint: There is a vertically oriented linear defect
within the anterior superior through anterior inferior cartilage
related to the nondisplaced acute to subacute fracture described
below.

Labrum: Grossly intact, but evaluation is limited by lack of
intraarticular fluid.

Bones: There is moderate marrow edema within the anterior aspect of
the glenoid surrounding a vertically-oriented nondisplaced fracture
extending from the anterior superior quadrant through the anterior
inferior quadrant (axial series 3 images 12 through 15).

Other: None.
IMPRESSION: :
IMPRESSION: 1. Partial-thickness midsubstance tear of the majority of the AP
dimension of the supraspinatus tendon midsubstance within the
posterior aspect and extending through the articular surface within
the anterior aspect.
2. There is an acute to subacute nondisplaced vertically oriented
fracture within the anterior superior through the anterior inferior
glenoid fossa with moderate surrounding marrow edema.

## 2024-06-07 ENCOUNTER — Telehealth: Payer: Self-pay

## 2024-06-07 NOTE — Telephone Encounter (Unsigned)
 Copied from CRM [id number]. Topic: Clinical - Medical Advice >> Jun 07, 2024  9:55 AM Deaijah H wrote: Reason for CRM: Patient called in stating she bought a vape pen that contains mullein extract, thyme extract, and mint extract along with glycerin and would like to know how safe was it to use only had 4 inhale puffs & that her sister used it as well with only 1 pull. Due to the allergies/congestion. Please call [phone]

## 2024-06-07 NOTE — Telephone Encounter (Signed)
**Note De-identified  [place] Obfuscation** Please advise 

## 2024-06-08 NOTE — Telephone Encounter (Signed)
 Copied from CRM [id number]. Topic: General - Other >> Jun 08, 2024  4:06 PM Burnard DEL wrote: Reason for CRM: Patient returned call to [facility] regarding vape pens. I relayed message to patient and she verbalized understanding.

## 2024-06-08 NOTE — Telephone Encounter (Signed)
 I don't consider any vape products to be safe.  I would avoid entirely

## 2024-06-08 NOTE — Telephone Encounter (Signed)
 Called patient and left vm to return call. Dr. Mahlon does not recommend any vape pens.

## 2024-06-21 ENCOUNTER — Ambulatory Visit (INDEPENDENT_AMBULATORY_CARE_PROVIDER_SITE_OTHER): Admitting: Family Medicine

## 2024-06-21 ENCOUNTER — Encounter: Payer: Self-pay | Admitting: Family Medicine

## 2024-06-21 VITALS — BP 120/72 | HR 88 | Temp 98.1°F | Ht 71.0 in | Wt 182.0 lb

## 2024-06-21 DIAGNOSIS — R0981 Nasal congestion: Secondary | ICD-10-CM

## 2024-06-21 MED ORDER — PREDNISONE 10 MG PO TABS: ORAL_TABLET | ORAL | 0 refills | Status: DC

## 2024-06-21 MED ORDER — AZELASTINE HCL 0.1 % NA SOLN: 1.0000 | Freq: Two times a day (BID) | NASAL | 5 refills | Status: AC

## 2024-06-21 MED ORDER — TRAZODONE HCL 100 MG PO TABS: 100.0000 mg | ORAL_TABLET | ORAL | 3 refills | Status: AC | PRN

## 2024-06-21 MED ORDER — LEVOCETIRIZINE DIHYDROCHLORIDE 5 MG PO TABS: 5.0000 mg | ORAL_TABLET | Freq: Every evening | ORAL | 3 refills | Status: AC

## 2024-06-21 NOTE — Progress Notes (Signed)
   Subjective:    Patient ID: Denise White, female    DOB: Feb 07, 1976, 49 y.o.   MRN: [id number]  HPI Sinus congestion- pt reports ongoing congestion, PND.  Sxs started ~2 weeks ago.  Has developed sinus HA.  Has been using sauna steamer w/o relief.  No fever.  No ear pain.  Denies tooth pain/pressure.  Not currently on allergy medication   Review of Systems For ROS see HPI     Objective:   Physical Exam Vitals reviewed.  Constitutional:      General: She is not in acute distress.    Appearance: Normal appearance. She is well-developed. She is not ill-appearing.  HENT:     Head: Normocephalic and atraumatic.     Right Ear: Tympanic membrane and ear canal normal.     Left Ear: Tympanic membrane and ear canal normal.     Nose: Mucosal edema, congestion and rhinorrhea present.     Right Sinus: No maxillary sinus tenderness or frontal sinus tenderness.     Left Sinus: No maxillary sinus tenderness or frontal sinus tenderness.     Mouth/Throat:     Pharynx: Posterior oropharyngeal erythema (w/ PND) present.  Eyes:     Conjunctiva/sclera: Conjunctivae normal.     Pupils: Pupils are equal, round, and reactive to light.  Cardiovascular:     Rate and Rhythm: Normal rate and regular rhythm.     Heart sounds: Normal heart sounds.  Pulmonary:     Effort: Pulmonary effort is normal. No respiratory distress.     Breath sounds: Normal breath sounds. No wheezing or rales.  Musculoskeletal:     Cervical back: Normal range of motion and neck supple.  Lymphadenopathy:     Cervical: No cervical adenopathy.  Skin:    General: Skin is warm and dry.  Neurological:     General: No focal deficit present.     Mental Status: She is alert and oriented to person, place, and time.  Psychiatric:        Mood and Affect: Mood normal.        Behavior: Behavior normal.        Thought Content: Thought content normal.           Assessment & Plan:  Sinus congestion- new.  No evidence of infxn  so no need for abx.  Restart daily allergy medication- prescriptions sent.  Start low dose prednisone  taper to treat congestion, inflammation, and drainage.  Reviewed supportive care and red flags that should prompt return.  Pt expressed understanding and is in agreement w/ plan.

## 2024-06-21 NOTE — Patient Instructions (Signed)
 Follow up as needed or as scheduled START the Prednisone  as directed- 3 pills at the same time x3 days, then 2 pills at the same time x3 days, then 1 pill daily.  Take w/ food  RESTART the Xyzal  daily ADD the nasal spray daily Drink LOTS of fluids Call with any questions or concerns Stay Safe!  Stay Healthy! Hang in there!!

## 2024-08-22 ENCOUNTER — Ambulatory Visit (INDEPENDENT_AMBULATORY_CARE_PROVIDER_SITE_OTHER): Payer: Self-pay | Admitting: Family Medicine

## 2024-08-22 ENCOUNTER — Encounter: Payer: Self-pay | Admitting: Family Medicine

## 2024-08-22 VITALS — BP 126/86 | HR 98 | Temp 98.4°F | Ht 70.0 in | Wt 179.0 lb

## 2024-08-22 DIAGNOSIS — Z1211 Encounter for screening for malignant neoplasm of colon: Secondary | ICD-10-CM

## 2024-08-22 DIAGNOSIS — M62830 Muscle spasm of back: Secondary | ICD-10-CM

## 2024-08-22 NOTE — Patient Instructions (Signed)
 Follow up as needed or as scheduled Take Ibuprofen  as needed for pain/muscle spasm Heat!  Massage!  Stretch! If no improvement in muscle tightness/spasm- let me know We'll call you with your GI appt Call with any questions or concerns Stay Safe!  Stay Healthy! Hang in there!!!

## 2024-08-22 NOTE — Progress Notes (Signed)
   Subjective:    Patient ID: Denise White, female    DOB: 1976/01/26, 48 y.o.   MRN: [id number]  HPI Family hx of colon cancer- sister just recently dx'd w/ stage 4 colon cancer at age 71.  Passed shortly after dx.  Pt due for colonoscopy.  Muscle tightness- R lower back, will occasionally have radiation into R leg.  Sxs started ~2 weeks ago.  Sxs improve w/ massage and stretching.  Sxs are intermittent.     Review of Systems For ROS see HPI     Objective:   Physical Exam Vitals reviewed.  Constitutional:      General: She is not in acute distress.    Appearance: Normal appearance. She is not ill-appearing.  HENT:     Head: Normocephalic and atraumatic.  Musculoskeletal:        General: Tenderness (mild TTP over R hamstring insertion, mild TTP over R lower back and glute) present. No swelling or deformity.  Skin:    General: Skin is warm and dry.  Neurological:     General: No focal deficit present.     Mental Status: She is alert and oriented to person, place, and time.     Cranial Nerves: No cranial nerve deficit.     Motor: No weakness.     Coordination: Coordination normal.     Gait: Gait normal.  Psychiatric:        Mood and Affect: Mood normal.        Behavior: Behavior normal.        Thought Content: Thought content normal.           Assessment & Plan:  Muscle spasm- pt's sxs are muscular in nature.  Some relief w/ massage, stretching.  Will add heat and NSAIDs PRN.  Pt to reach out if not improving.  Pt expressed understanding and is in agreement w/ plan.   Colon cancer screen- overdue.  Pt is now understandably concerned b/c older sister was just recently dx'd w/ Stage 4 colon cancer and passed within a few weeks.  Referral placed
